# Patient Record
Sex: Male | Born: 1955
Health system: Southern US, Community
[De-identification: ages and names within clinical notes are randomized; demographics above are authoritative.]

## PROBLEM LIST (undated history)

## (undated) DIAGNOSIS — I10 Essential (primary) hypertension: Secondary | ICD-10-CM

## (undated) DIAGNOSIS — M1990 Unspecified osteoarthritis, unspecified site: Secondary | ICD-10-CM

## (undated) DIAGNOSIS — N189 Chronic kidney disease, unspecified: Secondary | ICD-10-CM

## (undated) HISTORY — PX: ROTATOR CUFF REPAIR: SHX139

## (undated) HISTORY — PX: OTHER SURGICAL HISTORY: SHX169

## (undated) HISTORY — PX: ULNAR NERVE TRANSPOSITION: SHX2595

---

## 2010-04-12 ENCOUNTER — Encounter (HOSPITAL_BASED_OUTPATIENT_CLINIC_OR_DEPARTMENT_OTHER)
Admission: RE | Admit: 2010-04-12 | Discharge: 2010-04-12 | Disposition: A | Discharge status: Home / Self Care | Payer: Managed Care, Other (non HMO) | Source: Ambulatory Visit | Attending: Orthopedic Surgery | Admitting: Orthopedic Surgery

## 2010-04-12 DIAGNOSIS — G5620 Lesion of ulnar nerve, unspecified upper limb: Secondary | ICD-10-CM | POA: Insufficient documentation

## 2010-04-13 ENCOUNTER — Ambulatory Visit (HOSPITAL_BASED_OUTPATIENT_CLINIC_OR_DEPARTMENT_OTHER)
Admission: RE | Admit: 2010-04-13 | Discharge: 2010-04-13 | Disposition: A | Discharge status: Home / Self Care | Payer: Managed Care, Other (non HMO) | Source: Ambulatory Visit | Attending: Orthopedic Surgery | Admitting: Orthopedic Surgery

## 2010-04-13 DIAGNOSIS — M6740 Ganglion, unspecified site: Secondary | ICD-10-CM | POA: Insufficient documentation

## 2010-04-13 DIAGNOSIS — Z01812 Encounter for preprocedural laboratory examination: Secondary | ICD-10-CM | POA: Insufficient documentation

## 2010-04-13 DIAGNOSIS — Z01810 Encounter for preprocedural cardiovascular examination: Secondary | ICD-10-CM | POA: Insufficient documentation

## 2010-04-13 DIAGNOSIS — G5620 Lesion of ulnar nerve, unspecified upper limb: Secondary | ICD-10-CM | POA: Insufficient documentation

## 2010-04-13 DIAGNOSIS — M19019 Primary osteoarthritis, unspecified shoulder: Secondary | ICD-10-CM | POA: Insufficient documentation

## 2010-04-13 LAB — POCT HEMOGLOBIN-HEMACUE: Hemoglobin: 15.7 g/dL (ref 13.0–17.0)

## 2010-04-18 NOTE — Op Note (Signed)
NAME:  William, Higgins NO.:  [id number]  MEDICAL RECORD NO.:  [id number]          PATIENT TYPE:  LOCATION:                                 FACILITY:  PHYSICIAN:  Katy Fitch. Tremon Sainvil, M.D.      DATE OF BIRTH:  DATE OF PROCEDURE:  04/13/2010 DATE OF DISCHARGE:                              OPERATIVE REPORT   PREOPERATIVE DIAGNOSIS:  McGowan 3 ulnar neuropathy right elbow with background osteoarthrosis of humeral ulnar articulation.  POSTOPERATIVE DIAGNOSIS:  McGowan 3 ulnar neuropathy right elbow with background osteoarthrosis of humeral ulnar articulation and incidental identification of a myxoid cyst deep to the head of flexor carpi ulnaris likely due to underlying osteoarthrosis of the right humeral ulnar articulation.  OPERATION:  Anterior subcutaneous transposition of right ulnar nerve and resection of myxoid cyst medial aspect of right elbow, deep to the heads of flexor carpi ulnaris.  OPERATING SURGEON:  Katy Fitch. Christinea Brizuela, MD  ASSISTANT:  Marveen Reeks Dasnoit, PA-C  ANESTHESIA:  General by LMA.  SUPERVISING ANESTHESIOLOGIST:  Zenon Mayo, MD  INDICATIONS:  William Higgins is a 55 year old gentleman referred for evaluation and management of right hand weakness and ulnar distribution numbness.  Clinical examination revealed a very athletic 55 year old gentleman who enjoys recreational bike riding and many other athletic pursuits.  William Higgins had a history of progressive numbness in the small finger, ring finger, and dorsal aspect of the hand with proper ulnar distribution. William Higgins also had weakness of pinch prehension and grasp.  Electrodiagnostic studies of the upper extremities revealed a significant right ulnar neuropathy across the elbow.  Plain x-rays of his elbow revealed very significant degenerative change at the elbow due to probably a combination of genetics and his athletic pursuits.  William Higgins has a significant 15+ degree elbow flexion contracture  on the right.  William Higgins did have bony overgrowth at the medial humeral ulnar joint line.  We advised him that William Higgins had a McGowan grade 3 ulnar neuropathy and would benefit from decompression and probable anterior transposition of the nerve.  Our primary indication for transposition was bony overgrowth at the medial humeral ulnar joint line.  After informed consent, William Higgins was brought to the operating room at this time.  PROCEDURE:  William Higgins was brought to room I at the [facility] and placed in supine position on the operating table.  Following an anesthesia consult with Dr. Sampson Goon, general anesthesia by LMA technique was recommended and accepted.  Under Dr. Jarrett Ables direct supervision, general anesthesia by LMA technique was induced followed by routine Betadine scrub and paint of the right upper extremity.  Following exsanguination of the right arm with Esmarch bandage, the arterial tourniquet on proximal brachium was inflated to 220 mmHg.  A routine surgical time-out was accomplished followed by proceeding to create a 7-cm curvilinear incision paralleling the path of the ulnar nerve posterior to the right humeral epicondyle.  Subcutaneous tissues were carefully divided taking care to carefully identify and protect the posterior branch of the medial antebrachial cutaneous nerve.  The ulnar nerve was identified proximal to the epicondyle and decompressed distally with release of the  arcuate ligament, Osborne band, and the fascia at the head of flexor carpi ulnaris.  At the head of flexor carpi ulnaris, we encountered a myxoid cyst likely due to the arthritis at the humeral ulnar articulation.  This was decompressed.  The fascia of the flexor carpi ulnaris was released followed by teasing of muscle fibers. The motor branches of the flexor carpi ulnaris were preserved.  The nerve was mobilized over distance of 8 cm.  The medial intermuscular septum was resected with  cutting cautery down to the humeral shaft.  The nerve was carefully transposed taking care to protect the articular branch and after transposition with the elbow in 90 degrees of flexion, an adipose tissue wall was created at the epicondyle with a running suture of 3-0 Vicryl repairing the subcutaneous fat to the epicondyle. Care was taken to assure that there was excellent nerve glide.  The nerve was easily palpable beneath the skin due to the fact that William Higgins is quite lean.  Thereafter, the tourniquet was released.  Bleeding points were electrocauterized with bipolar current.  We assured that there was no kinking of the nerve at the head of carpal ulnaris by resection of a portion of the FCU fascia.  The wound was then repaired with subcutaneous suture of 3-0 Vicryl, followed by intradermal 3-0 Prolene with Steri-Strips.  The wound margins were infiltrated with 2% lidocaine for postop analgesia.  For aftercare, William Higgins is provided prescriptions for Percocet 5 mg one p.o. q.4-6 h p.r.n. pain, 20 tablets without refill.  Also, we have encouraged him to use Aleve as a nonsteroidal anti-inflammatory analgesic 2 tablets in the morning, and 2 tablets in the evening, also William Higgins is provided doxycycline 100 mg p.o. b.i.d. x4 days as prophylactic antibiotic.     [name], M.D.     RVS/MEDQ  D:  04/13/2010  T:  04/14/2010  Job:  [id number]  cc:   William Higgins, M.D.  Electronically Signed by Josephine Igo M.D. on 04/18/2010 02:52:10 PM

## 2012-08-26 ENCOUNTER — Encounter (HOSPITAL_COMMUNITY): Payer: Self-pay | Admitting: Emergency Medicine

## 2012-08-26 ENCOUNTER — Emergency Department (HOSPITAL_COMMUNITY)
Admission: EM | Admit: 2012-08-26 | Discharge: 2012-08-26 | Disposition: A | Discharge status: Home / Self Care | Payer: Managed Care, Other (non HMO) | Attending: Emergency Medicine | Admitting: Emergency Medicine

## 2012-08-26 DIAGNOSIS — Z79899 Other long term (current) drug therapy: Secondary | ICD-10-CM | POA: Insufficient documentation

## 2012-08-26 DIAGNOSIS — I10 Essential (primary) hypertension: Secondary | ICD-10-CM | POA: Insufficient documentation

## 2012-08-26 DIAGNOSIS — K623 Rectal prolapse: Secondary | ICD-10-CM

## 2012-08-26 HISTORY — DX: Essential (primary) hypertension: I10

## 2012-08-26 LAB — CBC WITH DIFFERENTIAL/PLATELET
Basophils Absolute: 0 10*3/uL (ref 0.0–0.1)
Basophils Relative: 1 % (ref 0–1)
Eosinophils Absolute: 0.1 10*3/uL (ref 0.0–0.7)
Eosinophils Relative: 2 % (ref 0–5)
HCT: 41 % (ref 39.0–52.0)
MCH: 30.8 pg (ref 26.0–34.0)
MCHC: 36.3 g/dL — ABNORMAL HIGH (ref 30.0–36.0)
MCV: 84.9 fL (ref 78.0–100.0)
Monocytes Absolute: 0.4 10*3/uL (ref 0.1–1.0)
Monocytes Relative: 6 % (ref 3–12)
RDW: 12.1 % (ref 11.5–15.5)

## 2012-08-26 LAB — BASIC METABOLIC PANEL
BUN: 24 mg/dL — ABNORMAL HIGH (ref 6–23)
Calcium: 9 mg/dL (ref 8.4–10.5)
Creatinine, Ser: 1.12 mg/dL (ref 0.50–1.35)
GFR calc Af Amer: 84 mL/min — ABNORMAL LOW (ref >90)

## 2012-08-26 MED ORDER — OXYCODONE-ACETAMINOPHEN 5-325 MG PO TABS: 1.0000 | ORAL_TABLET | ORAL | Status: DC | PRN

## 2012-08-26 MED ORDER — MORPHINE SULFATE 4 MG/ML IJ SOLN
4.0000 mg | Freq: Once | INTRAMUSCULAR | Status: AC
  Administered 2012-08-26: 4 mg via INTRAVENOUS
  Filled 2012-08-26: qty 1

## 2012-08-26 NOTE — ED Notes (Signed)
Pt has small part his rectum protruding.some amt of sugar added as per Dr Elesa Massed. Small amt of bleeding

## 2012-08-26 NOTE — ED Provider Notes (Signed)
TIME SEEN: 9:15 PM  CHIEF COMPLAINT: Rectal prolapse  HPI: Patient is a 57 y.o. Caucasian male with no significant past medical history presents emergency department with rectal prolapse. Patient reports that he has had similar episodes since June of 2014 that he has been able to reduce at home. He states today he had a normal bowel movement and during the bowel movement his rectum prolapsing he's been unable to reduce it. He states he feels uncomfortable but is not complaining of significant pain. No fever. No vomiting. He's never had to come to the hospital for infection in the past. No history of surgery to this area.  ROS: See HPI Constitutional: no fever  Eyes: no drainage  ENT: no runny nose   Cardiovascular:  no chest pain  Resp: no SOB  GI: no vomiting GU: no dysuria Integumentary: no rash  Allergy: no hives  Musculoskeletal: no leg swelling  Neurological: no slurred speech ROS otherwise negative  PAST MEDICAL HISTORY/PAST SURGICAL HISTORY:  Past Medical History  Diagnosis Date  . Hypertension     MEDICATIONS:  Prior to Admission medications   Medication Sig Start Date End Date Taking? Authorizing Provider  cholecalciferol (VITAMIN D) 1000 UNITS tablet Take 1,000 Units by mouth daily.   Yes Historical Provider, MD  Multiple Vitamin (MULTIVITAMIN WITH MINERALS) TABS tablet Take 1 tablet by mouth daily.   Yes Historical Provider, MD  naproxen sodium (ANAPROX) 220 MG tablet Take 220 mg by mouth 2 (two) times daily as needed (for pain).   Yes Historical Provider, MD  vitamin B-12 (CYANOCOBALAMIN) 1000 MCG tablet Take 1,000 mcg by mouth daily.   Yes Historical Provider, MD    ALLERGIES:  No Known Allergies  SOCIAL HISTORY:  History  Substance Use Topics  . Smoking status: Never Smoker   . Smokeless tobacco: Not on file  . Alcohol Use: Yes     Comment: social     FAMILY HISTORY: Family History  Problem Relation Age of Onset  . Cancer Mother   . Cancer Father   .  Hypertension Other     EXAM: BP 156/121  Pulse 72  Temp(Src) 98.1 F (36.7 C) (Oral)  Resp 18  SpO2 97% CONSTITUTIONAL: Alert and oriented and responds appropriately to questions. Well-appearing; well-nourished HEAD: Normocephalic EYES: Conjunctivae clear, PERRL ENT: normal nose; no rhinorrhea; moist mucous membranes; pharynx without lesions noted NECK: Supple, no meningismus, no LAD  CARD: RRR; S1 and S2 appreciated; no murmurs, no clicks, no rubs, no gallops RESP: Normal chest excursion without splinting or tachypnea; breath sounds clear and equal bilaterally; no wheezes, no rhonchi, no rales,  ABD/GI: Normal bowel sounds; non-distended; soft, non-tender, no rebound, no guarding RECTAL:  Patient has a large rectal prolapse with gross blood, I am able to reduce part of the prolapse but it will immediately come back out of the rectum BACK:  The back appears normal and is non-tender to palpation, there is no CVA tenderness EXT: Normal ROM in all joints; non-tender to palpation; no edema; normal capillary refill; no cyanosis    SKIN: Normal color for age and race; warm NEURO: Moves all extremities equally PSYCH: The patient's mood and manner are appropriate. Grooming and personal hygiene are appropriate.  MEDICAL DECISION MAKING: Patient with large rectal prolapse that I am unable to reduce. Will place sugar on the rectum to attempt to decrease swelling and reattempt a manual reduction. Anticipate however that surgery will need to be involved given the size of patient's prolapse. No  signs or symptoms of strangulation at this time.  ED PROGRESS: I was able to fully reduce patient's rectal prolapse after sugar had been applied. Patient reports feeling much better. We'll have him followup with surgery as an outpatient. Given customary usual return precautions. Given instructions to increase water and fiber intake, take MiraLax over-the-counter to keep his stool soft, avoid any heavy lifting or  straining, avoid any cycling at this time. Patient verbalizes understanding and is comfortable with plan.  PROCEDURE: Reduction of rectal prolapse. I attempted to reduce patient's rectal prolapse manually. It was unsuccessful with first attempt. She replied to the area but said for 30 minutes to reduce swelling. I was then able to successfully reduce his rectal prolapse with a second attempt.  Layla Maw Parker Sawatzky, DO 08/26/12 2234

## 2012-08-26 NOTE — ED Notes (Signed)
Pt states he went home today and went to have a bowel movement and now has prolapsed bowel  Pt states this happened around 1830

## 2012-09-23 ENCOUNTER — Ambulatory Visit (INDEPENDENT_AMBULATORY_CARE_PROVIDER_SITE_OTHER): Payer: Managed Care, Other (non HMO) | Admitting: General Surgery

## 2012-09-23 ENCOUNTER — Encounter (INDEPENDENT_AMBULATORY_CARE_PROVIDER_SITE_OTHER): Payer: Self-pay | Admitting: General Surgery

## 2012-09-23 VITALS — BP 130/96 | HR 88 | Resp 14 | Ht 69.0 in | Wt 178.4 lb

## 2012-09-23 DIAGNOSIS — K623 Rectal prolapse: Secondary | ICD-10-CM

## 2012-09-23 NOTE — Patient Instructions (Signed)
Rectal Prolapse  What is rectal prolapse? Rectal prolapse is a condition in which the rectum (the lower end of the colon, located just above the anus) becomes stretched out and protrudes out of the anus. Weakness of the anal sphincter muscle is often associated with rectal prolapse at this stage, resulting in leakage of stool or mucus. While the condition occurs in both sexes, it is much more common in women than men. Why does it occur? Several factors may contribute to the development of rectal prolapse. It may come from a lifelong habit of straining to have bowel movements or as a late consequence of the childbirth process. Rarely, there may be a genetic predisposition. It seems to be a part of the aging process in many patients who experience stretching of the ligaments that support the rectum inside the pelvis as well as weakening of the anal sphincter muscle. Sometimes rectal prolapse results from generalized pelvic floor dysfunction, in association with urinary incontinence and pelvic organ prolapse as well. Neurological problems, such as spinal cord transection or spinal cord disease, can also lead to prolapse. In most cases, however, no single cause is identified. Is rectal prolapse the same as hemorrhoids? Some of the symptoms may be the same: bleeding and/or tissue that protrudes from the rectum. Rectal prolapse, however, involves a segment of the bowel located higher up within the body, while hemorrhoids develop near the anal opening. How is rectal prolapse diagnosed? A physician can often diagnose this condition with a careful history and a complete anorectal examination. To demonstrate the prolapse, patients may be asked to sit on a commode and "strain" as if having a bowel movement. Occasionally, a rectal prolapse may be "hidden" or internal, making the diagnosis more difficult. In this situation, an x-ray examination called a videodefecogram may be helpful. This examination, which takes  x-ray pictures while the patient is having a bowel movement, can also assist the physician in determining whether surgery may be beneficial and which operation may be appropriate. Anorectal manometry may also be used to evaluate the function of the muscles around the rectum as they relate to having a bowel movement. How is rectal prolapse treated? Although constipation and straining may contribute to the development of rectal prolapse, simply correcting these problems may not improve the prolapse once it has developed. There are many different ways to surgically correct rectal prolapse. Abdominal or rectal surgery may be suggested. An abdominal repair may be approached laparoscopically in selected patients. The decision to recommend an abdominal or rectal surgery takes into account many factors, including age, physical condition, extent of prolapse and the results of various tests. How successful is treatment? A great majority of patients are completely relieved of symptoms, or are significantly helped, by the appropriate procedure. Success depends on many factors, including the status of a patient's anal sphincter muscle before surgery, whether the prolapse is internal or external, the overall condition of the patient. If the anal sphincter muscles have been weakened, either because of the rectal prolapse or for some other reason, they have the potential to regain strength after the rectal prolapse has been corrected. It may take up to a year to determine the ultimate impact of the surgery on bowel function. Chronic constipation and straining after surgical correction should be avoided.  What is a colon and rectal surgeon? Colon and rectal surgeons are experts in the surgical and non-surgical treatment of diseases of the colon, rectum and anus. They have completed advanced surgical training in the treatment   of these diseases as well as full general surgical training. Board-certified colon and rectal surgeons  complete residencies in general surgery and colon and rectal surgery, and pass intensive examinations conducted by the American Board of Surgery and the American Board of Colon and Rectal Surgery. They are well-versed in the treatment of both benign and malignant diseases of the colon, rectum and anus and are able to perform routine screening examinations and surgically treat conditions if indicated to do so.   2012 American Society of Colon & Rectal Surgeons  

## 2012-09-23 NOTE — Progress Notes (Signed)
Chief Complaint  Patient presents with  . New Evaluation    rectal prolapse    HISTORY: William Higgins is a 57 y.o. male who presents to the office with rectal prolapse.  Other symptoms include bloody discharge after a prolapse.  This had been occurring for several months.   Nothing makes the symptoms worse.   It is intermittent in nature.  His bowel habits are regular and his bowel movements are usually soft.  His fiber intake is good.  His last colonoscopy was in 6/13 and was normal.  He was seen in the ED last month for rectal prolapse reduction.     Past Medical History  Diagnosis Date  . Hypertension       Past Surgical History  Procedure Laterality Date  . Rotator cuff repair    . Vastectomy    . Ulnar nerve transposition          Current Outpatient Prescriptions  Medication Sig Dispense Refill  . cholecalciferol (VITAMIN D) 1000 UNITS tablet Take 1,000 Units by mouth daily.      . Multiple Vitamin (MULTIVITAMIN WITH MINERALS) TABS tablet Take 1 tablet by mouth daily.      . naproxen sodium (ANAPROX) 220 MG tablet Take 220 mg by mouth 2 (two) times daily as needed (for pain).      . vitamin B-12 (CYANOCOBALAMIN) 1000 MCG tablet Take 1,000 mcg by mouth daily.       No current facility-administered medications for this visit.      No Known Allergies    Family History  Problem Relation Age of Onset  . Cancer Mother     breast  . Cancer Father     melenoma, prostate  . Hypertension Other     History   Social History  . Marital Status: Married    Spouse Name: N/A    Number of Children: N/A  . Years of Education: N/A   Social History Main Topics  . Smoking status: Never Smoker   . Smokeless tobacco: Never Used  . Alcohol Use: Yes     Comment: social   . Drug Use: No  . Sexual Activity: None   Other Topics Concern  . None   Social History Narrative  . None      REVIEW OF SYSTEMS - PERTINENT POSITIVES ONLY: Review of Systems - General ROS: negative for -  chills, fever or weight loss Hematological and Lymphatic ROS: negative for - bleeding problems, blood clots or bruising Respiratory ROS: no cough, shortness of breath, or wheezing Cardiovascular ROS: no chest pain or dyspnea on exertion Gastrointestinal ROS: no abdominal pain, change in bowel habits, or black or bloody stools Genito-Urinary ROS: no dysuria, trouble voiding, or hematuria  EXAM: Filed Vitals:   09/23/12 1139  BP: 130/96  Pulse: 88  Resp: 14    General appearance: alert and cooperative Resp: clear to auscultation bilaterally Cardio: regular rate and rhythm GI: soft, non-tender; bowel sounds normal; no masses,  no organomegaly   Procedure: Anoscopy Surgeon: Maisie Fus Diagnosis: rectal prolapse  Assistant: Christella Scheuermann After the risks and benefits were explained, verbal consent was obtained for above procedure  Anesthesia: none Findings: rectal mucosa, left lateral internal hemorrhoid, good rectal tone and squeeze, unable to reproduce at this time    ASSESSMENT AND PLAN: William Higgins is a 57 y.o. M who has had a couple episodes of rectal prolapse, one of which he could not reduce on its own and required reduction in the ED with  sugar.  We discussed the risk and benefits of surgery versus medical management. He is currently on a high-fiber diet with no signs of constipation. He does have a long history of weightlifting and straining in the past. He has only had a couple episodes and can't reproduce it in the office, I recommended that he continue to treat this medically at this time. He understands that if he has a prolapse that cannot be reduced, he needs to to an emergency department as soon as possible for immediate reduction.  We discussed possible surgical options including perineal repairs and laparoscopic rectopexy. We discussed the perineal repairs to recur quickly and that given his good health, he would be a better laparoscopic rectopexy candidate.  I do not think we  would need to do a sigmoid resection. He was given information about this condition as well. I showed his wife the correct method to reduce the prolapse at home. They will call me if his prolapse continues to worsen but will continue with medical management with a high fiber diet and avoidance of straining.  He is not interested in undergoing a surgical procedure at this time.    Vanita Panda, MD Colon and Rectal Surgery / General Surgery [facility]      Visit Diagnoses: 1. Rectal prolapse     Primary Care Physician: Gretel Acre, MD

## 2012-09-29 ENCOUNTER — Encounter (INDEPENDENT_AMBULATORY_CARE_PROVIDER_SITE_OTHER): Payer: Self-pay

## 2014-06-09 ENCOUNTER — Other Ambulatory Visit: Payer: Self-pay | Admitting: Family Medicine

## 2014-06-09 DIAGNOSIS — N189 Chronic kidney disease, unspecified: Secondary | ICD-10-CM

## 2014-06-14 ENCOUNTER — Ambulatory Visit
Admission: RE | Admit: 2014-06-14 | Discharge: 2014-06-14 | Disposition: A | Discharge status: Home / Self Care | Payer: Managed Care, Other (non HMO) | Source: Ambulatory Visit | Attending: Family Medicine | Admitting: Family Medicine

## 2014-06-14 DIAGNOSIS — N189 Chronic kidney disease, unspecified: Secondary | ICD-10-CM

## 2016-04-10 IMAGING — US US RENAL
1 series · 14 of 25 positions shown · non-contrast
Comparison: No previous studies available for review

CLINICAL DATA: Chronic renal insufficiency, hypertension.

EXAM:
RENAL / URINARY TRACT ULTRASOUND COMPLETE

[Series 1: us renal · 0.32mm/px · 14 of 43 slices shown]
[im 1/43]
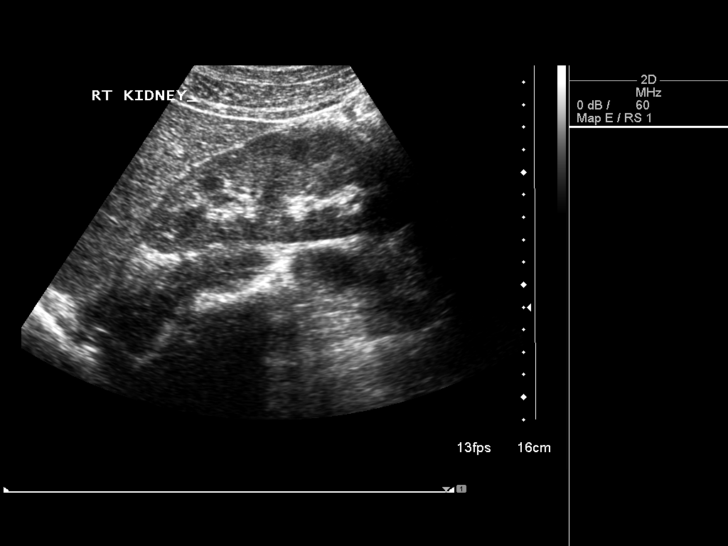
[im 4/43]
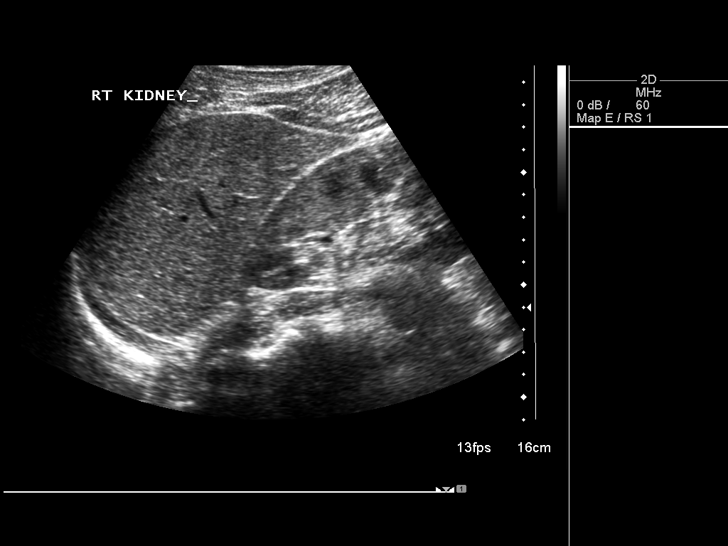
[im 8/43]
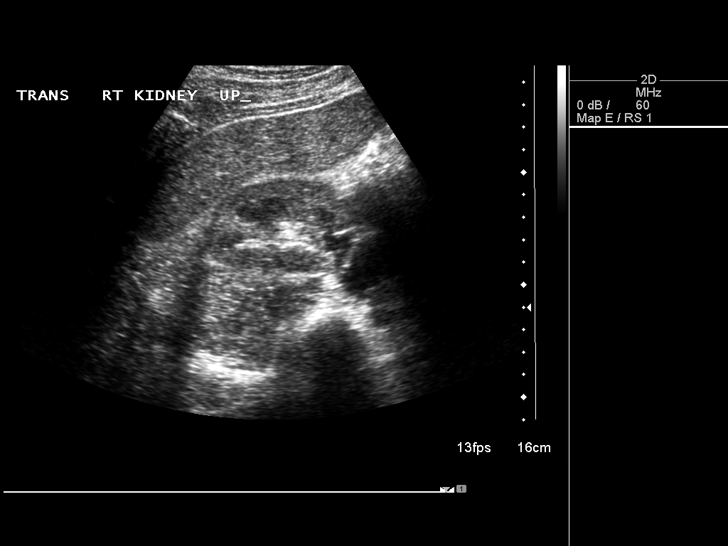
[im 11/43]
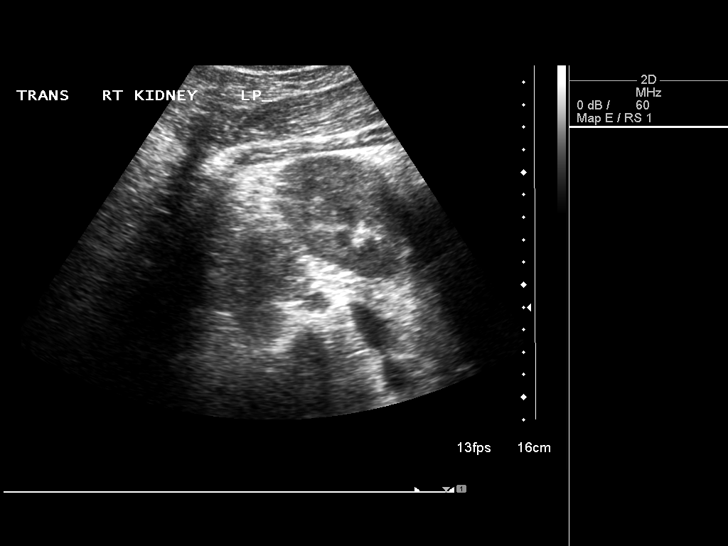
[im 15/43]
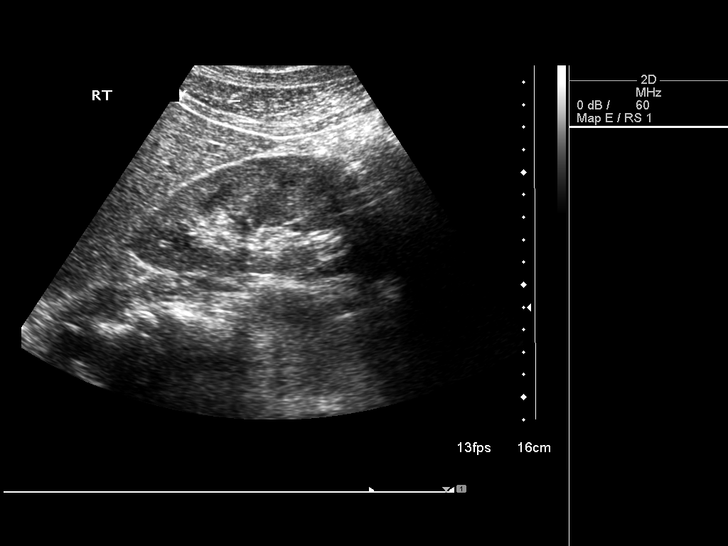
[im 16/43]
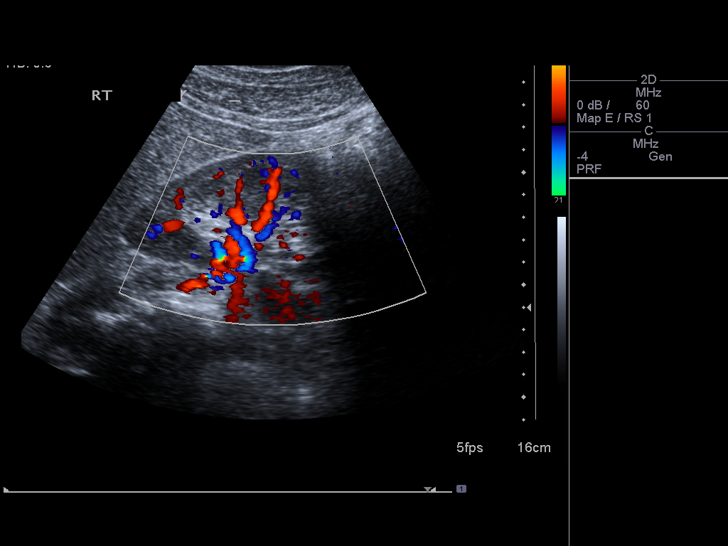
[im 20/43]
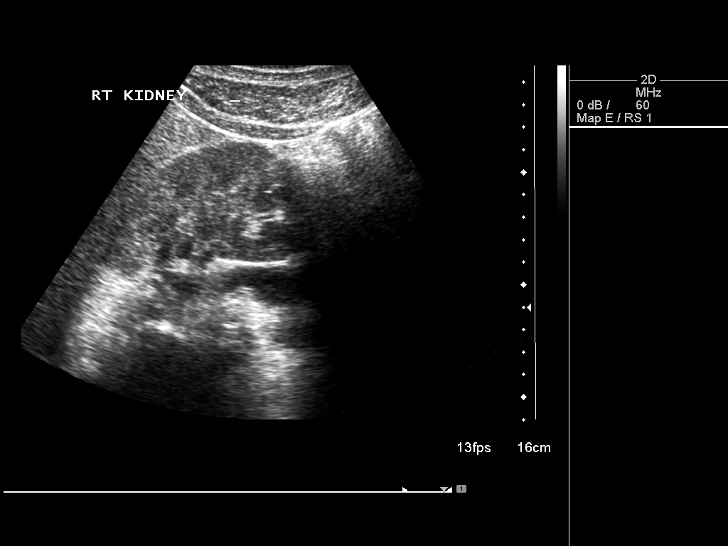
[im 23/43]
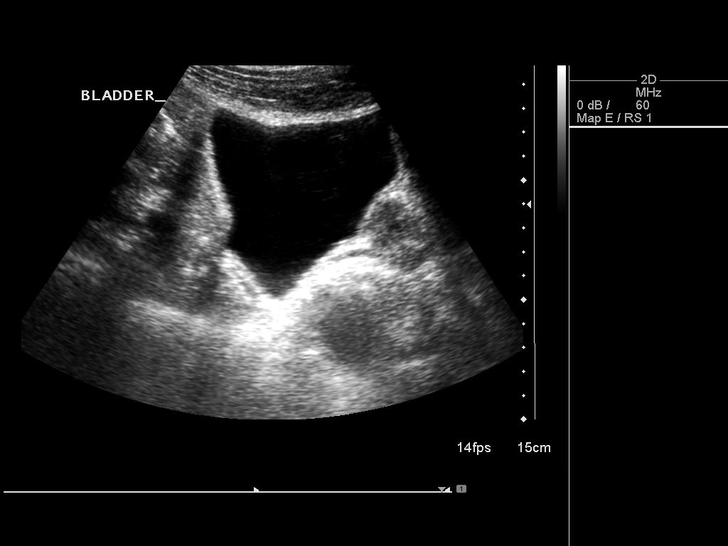
[im 27/43]
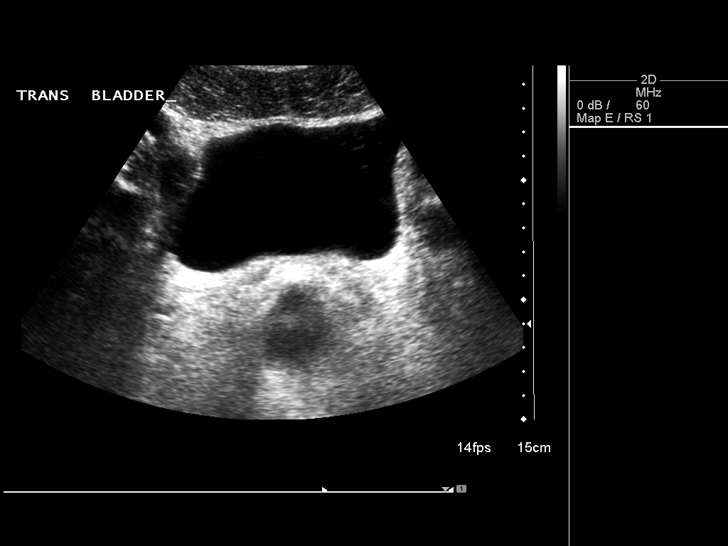
[im 29/43]
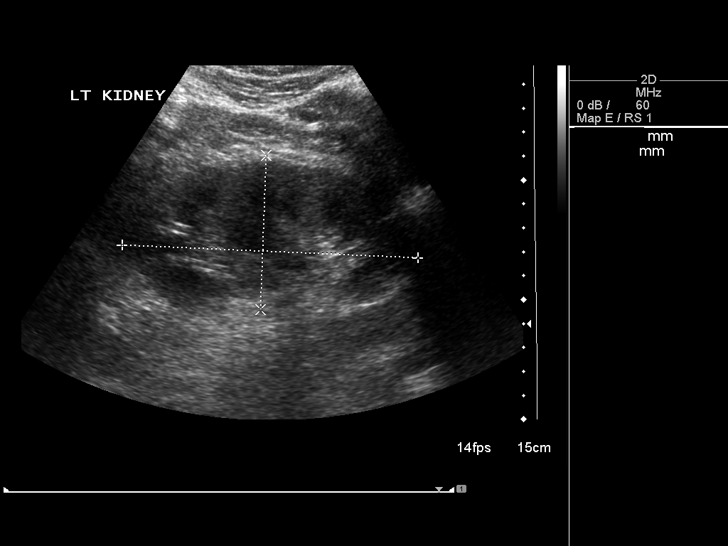
[im 32/43]
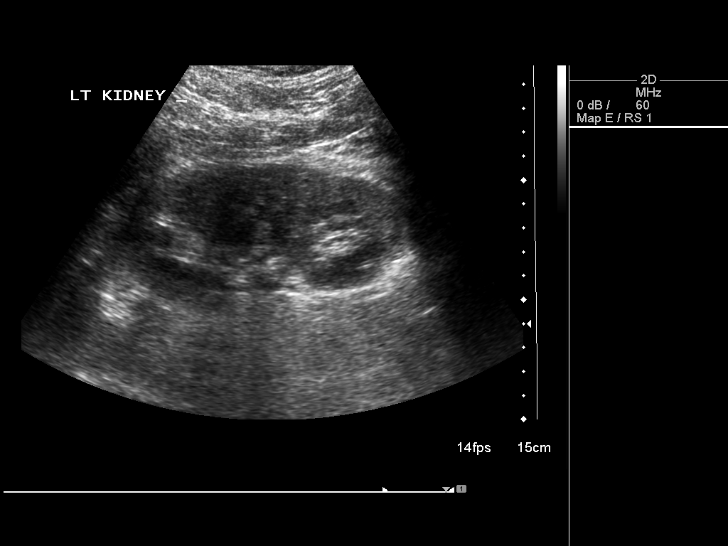
[im 36/43]
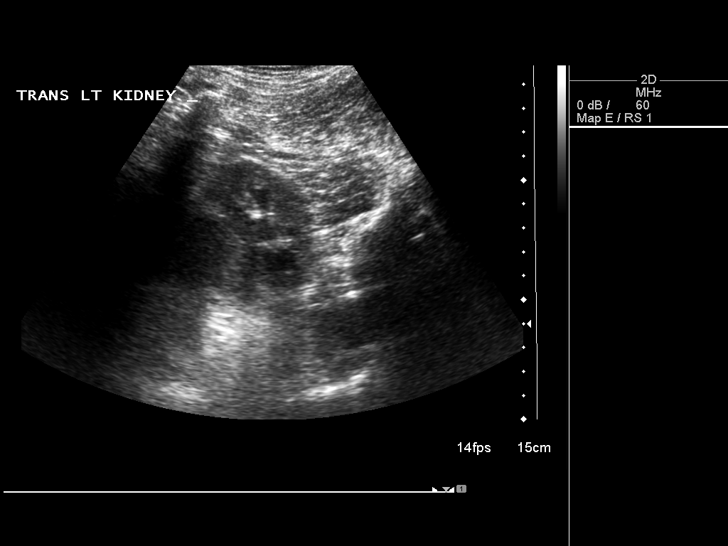
[im 39/43]
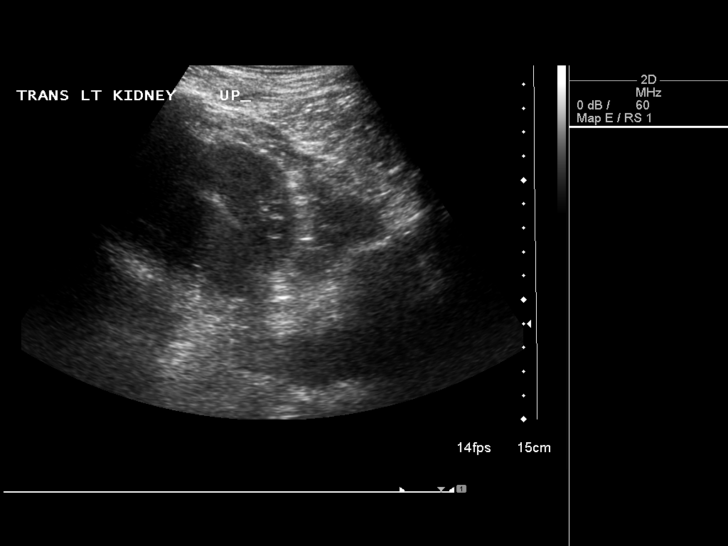
[im 43/43]
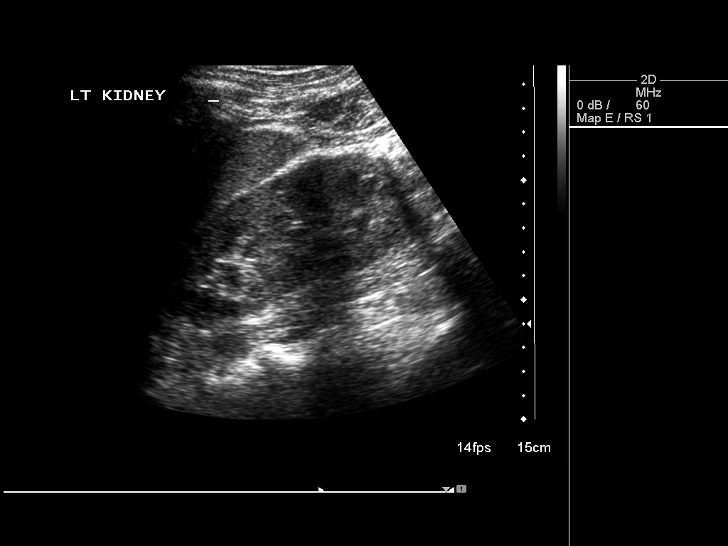

[14 of 25 positions shown; findings below may reference images not displayed]

FINDINGS: Right Kidney:

Length: 12.6 cm. The renal cortical echotexture is slightly less
than that of the adjacent liver. No mass or hydronephrosis
visualized.

Left Kidney:

Length: 12.4 cm. Echogenicity within normal limits. No mass or
hydronephrosis visualized.

Bladder:

The prostate gland is enlarged measuring 7.7 x 5.5 x 7.4 cm. This
produces a moderate impression upon the urinary bladder base.
IMPRESSION: 1. The renal cortical echotexture is mildly increased consistent
with medical renal disease. There is no hydronephrosis. No
parenchymal masses are observed.
2. Prostatic enlargement produces a moderate impression upon the
urinary bladder base.

## 2016-11-26 ENCOUNTER — Ambulatory Visit: Payer: Self-pay | Admitting: Orthopedic Surgery

## 2016-12-04 ENCOUNTER — Encounter (HOSPITAL_COMMUNITY): Payer: Self-pay | Admitting: *Deleted

## 2016-12-05 ENCOUNTER — Ambulatory Visit: Payer: Self-pay | Admitting: Orthopedic Surgery

## 2016-12-05 NOTE — H&P (Signed)
TOTAL HIP ADMISSION H&P  Patient is admitted for right total hip arthroplasty.  Subjective:  Chief Complaint: right hip pain  HPI: William Higgins, [age] y.o. male, has a history of pain and functional disability in the right hip(s) due to arthritis and patient has failed non-surgical conservative treatments for greater than 12 weeks to include NSAID's and/or analgesics, flexibility and strengthening excercises, use of assistive devices, weight reduction as appropriate and activity modification.  Onset of symptoms was gradual starting 2 years ago with rapidlly worsening course since that time.The patient noted no past surgery on the right hip(s).  Patient currently rates pain in the right hip at 10 out of 10 with activity. Patient has night pain, worsening of pain with activity and weight bearing, pain that interfers with activities of daily living, pain with passive range of motion and crepitus. Patient has evidence of subchondral sclerosis, periarticular osteophytes and joint space narrowing by imaging studies. This condition presents safety issues increasing the risk of falls. There is no current active infection.  Patient Active Problem List   Diagnosis Date Noted  . Rectal prolapse 09/23/2012   Past Medical History:  Diagnosis Date  . Hypertension     Past Surgical History:  Procedure Laterality Date  . ROTATOR CUFF REPAIR    . ULNAR NERVE TRANSPOSITION    . vastectomy      Current Outpatient Medications  Medication Sig Dispense Refill Last Dose  . cholecalciferol (VITAMIN D) 1000 UNITS tablet Take 1,000 Units by mouth daily.   Taking  . Multiple Vitamin (MULTIVITAMIN WITH MINERALS) TABS tablet Take 1 tablet by mouth daily.   Taking  . naproxen sodium (ANAPROX) 220 MG tablet Take 220 mg by mouth 2 (two) times daily as needed (for pain).   Taking  . vitamin B-12 (CYANOCOBALAMIN) 1000 MCG tablet Take 1,000 mcg by mouth daily.   Taking   No current facility-administered medications for  this visit.    No Known Allergies  Social History   Tobacco Use  . Smoking status: Never Smoker  . Smokeless tobacco: Never Used  Substance Use Topics  . Alcohol use: Yes    Comment: social     Family History  Problem Relation Age of Onset  . Cancer Mother        breast  . Cancer Father        melenoma, prostate  . Hypertension Other      Review of Systems  Constitutional: Negative.   HENT: Negative.   Eyes: Negative.   Respiratory: Negative.   Cardiovascular: Negative.   Gastrointestinal: Negative.   Genitourinary: Negative.   Musculoskeletal: Positive for joint pain.  Skin: Negative.   Neurological: Negative.   Endo/Heme/Allergies: Negative.     Objective:  Physical Exam  Vitals reviewed. Constitutional: He is oriented to person, place, and time. He appears well-developed and well-nourished.  HENT:  Head: Normocephalic and atraumatic.  Eyes: Conjunctivae and EOM are normal. Pupils are equal, round, and reactive to light.  Neck: Normal range of motion. Neck supple.  Cardiovascular: Normal rate, regular rhythm and intact distal pulses.  Respiratory: Effort normal. No respiratory distress.  GI: Soft. He exhibits no distension.  Genitourinary:  Genitourinary Comments: deferred  Musculoskeletal:       Right hip: He exhibits decreased range of motion, bony tenderness and crepitus.  Neurological: He is alert and oriented to person, place, and time. He has normal reflexes.  Skin: Skin is warm and dry.  Psychiatric: He has a normal mood and  affect. His behavior is normal. Judgment and thought content normal.    Vital signs in last 24 hours: @VSRANGES @  Labs:   Estimated body mass index is 26.35 kg/m as calculated from the following:   Height as of 09/23/12: 5\' 9"  (1.753 m).   Weight as of 09/23/12: 80.9 kg (178 lb 6.4 oz).   Imaging Review Plain radiographs demonstrate severe degenerative joint disease of the right hip(s). The bone quality appears to be  adequate for age and reported activity level.  Assessment/Plan:  End stage arthritis, right hip(s)  The patient history, physical examination, clinical judgement of the provider and imaging studies are consistent with end stage degenerative joint disease of the right hip(s) and total hip arthroplasty is deemed medically necessary. The treatment options including medical management, injection therapy, arthroscopy and arthroplasty were discussed at length. The risks and benefits of total hip arthroplasty were presented and reviewed. The risks due to aseptic loosening, infection, stiffness, dislocation/subluxation,  thromboembolic complications and other imponderables were discussed.  The patient acknowledged the explanation, agreed to proceed with the plan and consent was signed. Patient is being admitted for inpatient treatment for surgery, pain control, PT, OT, prophylactic antibiotics, VTE prophylaxis, progressive ambulation and ADL's and discharge planning.The patient is planning to be discharged home with HEP

## 2016-12-05 NOTE — H&P (View-Only) (Signed)
TOTAL HIP ADMISSION H&P  Patient is admitted for right total hip arthroplasty.  Subjective:  Chief Complaint: right hip pain  HPI: William Higgins, [age] y.o. male, has a history of pain and functional disability in the right hip(s) due to arthritis and patient has failed non-surgical conservative treatments for greater than 12 weeks to include NSAID's and/or analgesics, flexibility and strengthening excercises, use of assistive devices, weight reduction as appropriate and activity modification.  Onset of symptoms was gradual starting 2 years ago with rapidlly worsening course since that time.The patient noted no past surgery on the right hip(s).  Patient currently rates pain in the right hip at 10 out of 10 with activity. Patient has night pain, worsening of pain with activity and weight bearing, pain that interfers with activities of daily living, pain with passive range of motion and crepitus. Patient has evidence of subchondral sclerosis, periarticular osteophytes and joint space narrowing by imaging studies. This condition presents safety issues increasing the risk of falls. There is no current active infection.  Patient Active Problem List   Diagnosis Date Noted  . Rectal prolapse 09/23/2012   Past Medical History:  Diagnosis Date  . Hypertension     Past Surgical History:  Procedure Laterality Date  . ROTATOR CUFF REPAIR    . ULNAR NERVE TRANSPOSITION    . vastectomy      Current Outpatient Medications  Medication Sig Dispense Refill Last Dose  . cholecalciferol (VITAMIN D) 1000 UNITS tablet Take 1,000 Units by mouth daily.   Taking  . Multiple Vitamin (MULTIVITAMIN WITH MINERALS) TABS tablet Take 1 tablet by mouth daily.   Taking  . naproxen sodium (ANAPROX) 220 MG tablet Take 220 mg by mouth 2 (two) times daily as needed (for pain).   Taking  . vitamin B-12 (CYANOCOBALAMIN) 1000 MCG tablet Take 1,000 mcg by mouth daily.   Taking   No current facility-administered medications for  this visit.    No Known Allergies  Social History   Tobacco Use  . Smoking status: Never Smoker  . Smokeless tobacco: Never Used  Substance Use Topics  . Alcohol use: Yes    Comment: social     Family History  Problem Relation Age of Onset  . Cancer Mother        breast  . Cancer Father        melenoma, prostate  . Hypertension Other      Review of Systems  Constitutional: Negative.   HENT: Negative.   Eyes: Negative.   Respiratory: Negative.   Cardiovascular: Negative.   Gastrointestinal: Negative.   Genitourinary: Negative.   Musculoskeletal: Positive for joint pain.  Skin: Negative.   Neurological: Negative.   Endo/Heme/Allergies: Negative.     Objective:  Physical Exam  Vitals reviewed. Constitutional: He is oriented to person, place, and time. He appears well-developed and well-nourished.  HENT:  Head: Normocephalic and atraumatic.  Eyes: Conjunctivae and EOM are normal. Pupils are equal, round, and reactive to light.  Neck: Normal range of motion. Neck supple.  Cardiovascular: Normal rate, regular rhythm and intact distal pulses.  Respiratory: Effort normal. No respiratory distress.  GI: Soft. He exhibits no distension.  Genitourinary:  Genitourinary Comments: deferred  Musculoskeletal:       Right hip: He exhibits decreased range of motion, bony tenderness and crepitus.  Neurological: He is alert and oriented to person, place, and time. He has normal reflexes.  Skin: Skin is warm and dry.  Psychiatric: He has a normal mood and  affect. His behavior is normal. Judgment and thought content normal.    Vital signs in last 24 hours: @VSRANGES@  Labs:   Estimated body mass index is 26.35 kg/m as calculated from the following:   Height as of 09/23/12: 5' 9" (1.753 m).   Weight as of 09/23/12: 80.9 kg (178 lb 6.4 oz).   Imaging Review Plain radiographs demonstrate severe degenerative joint disease of the right hip(s). The bone quality appears to be  adequate for age and reported activity level.  Assessment/Plan:  End stage arthritis, right hip(s)  The patient history, physical examination, clinical judgement of the provider and imaging studies are consistent with end stage degenerative joint disease of the right hip(s) and total hip arthroplasty is deemed medically necessary. The treatment options including medical management, injection therapy, arthroscopy and arthroplasty were discussed at length. The risks and benefits of total hip arthroplasty were presented and reviewed. The risks due to aseptic loosening, infection, stiffness, dislocation/subluxation,  thromboembolic complications and other imponderables were discussed.  The patient acknowledged the explanation, agreed to proceed with the plan and consent was signed. Patient is being admitted for inpatient treatment for surgery, pain control, PT, OT, prophylactic antibiotics, VTE prophylaxis, progressive ambulation and ADL's and discharge planning.The patient is planning to be discharged home with HEP 

## 2016-12-14 ENCOUNTER — Other Ambulatory Visit (HOSPITAL_COMMUNITY): Payer: Self-pay | Admitting: Emergency Medicine

## 2016-12-14 NOTE — Patient Instructions (Signed)
William BustardDavid Higgins  12/14/2016   Your procedure is scheduled on: 12-20-16  [facility]  Entrance [place]  elevators to 3rd floor to  Short Stay Center at 530AM.   Call this number if you have problems the morning of surgery [phone]    Remember: ONLY 1 PERSON MAY GO WITH YOU TO SHORT STAY TO GET  READY MORNING OF YOUR SURGERY.    Do not eat food or drink liquids :After Midnight.     Take these medicines the morning of surgery with A SIP OF WATER: amlodipine                                 You may not have any metal on your body including hair pins and              piercings  Do not wear jewelry, make-up, lotions, powders or perfumes, deodorant                    Men may shave face and neck.   Do not bring valuables to the hospital. [facility] IS NOT             RESPONSIBLE   FOR VALUABLES.  Contacts, dentures or bridgework may not be worn into surgery.  Leave suitcase in the car. After surgery it may be brought to your room.                 Please read over the following fact sheets you were given: _____________________________________________________________________             [name] - Preparing for Surgery Before surgery, you can play an important role.  Because skin is not sterile, your skin needs to be as free of germs as possible.  You can reduce the number of germs on your skin by washing with CHG (chlorahexidine gluconate) soap before surgery.  CHG is an antiseptic cleaner which kills germs and bonds with the skin to continue killing germs even after washing. Please DO NOT use if you have an allergy to CHG or antibacterial soaps.  If your skin becomes reddened/irritated stop using the CHG and inform your nurse when you arrive at Short Stay. Do not shave (including legs and underarms) for at least 48 hours prior to the first CHG shower.  You may shave your face/neck. Please follow these instructions carefully:  1.  Shower  with CHG Soap the night before surgery and the  morning of Surgery.  2.  If you choose to wash your hair, wash your hair first as usual with your  normal  shampoo.  3.  After you shampoo, rinse your hair and body thoroughly to remove the  shampoo.                           4.  Use CHG as you would any other liquid soap.  You can apply chg directly  to the skin and wash                       Gently with a scrungie or clean washcloth.  5.  Apply the CHG Soap to your body ONLY FROM THE NECK DOWN.   Do not use on face/ open  Wound or open sores. Avoid contact with eyes, ears mouth and genitals (private parts).                       Wash face,  Genitals (private parts) with your normal soap.             6.  Wash thoroughly, paying special attention to the area where your surgery  will be performed.  7.  Thoroughly rinse your body with warm water from the neck down.  8.  DO NOT shower/wash with your normal soap after using and rinsing off  the CHG Soap.                9.  Pat yourself dry with a clean towel.            10.  Wear clean pajamas.            11.  Place clean sheets on your bed the night of your first shower and do not  sleep with pets. Day of Surgery : Do not apply any lotions/deodorants the morning of surgery.  Please wear clean clothes to the hospital/surgery center.  FAILURE TO FOLLOW THESE INSTRUCTIONS MAY RESULT IN THE CANCELLATION OF YOUR SURGERY PATIENT SIGNATURE_________________________________  NURSE SIGNATURE__________________________________  ________________________________________________________________________   William Higgins  An incentive spirometer is a tool that can help keep your lungs clear and active. This tool measures how well you are filling your lungs with each breath. Taking long deep breaths may help reverse or decrease the chance of developing breathing (pulmonary) problems (especially infection) following:  A long period  of time when you are unable to move or be active. BEFORE THE PROCEDURE   If the spirometer includes an indicator to show your best effort, your nurse or respiratory therapist will set it to a desired goal.  If possible, sit up straight or lean slightly forward. Try not to slouch.  Hold the incentive spirometer in an upright position. INSTRUCTIONS FOR USE  1. Sit on the edge of your bed if possible, or sit up as far as you can in bed or on a chair. 2. Hold the incentive spirometer in an upright position. 3. Breathe out normally. 4. Place the mouthpiece in your mouth and seal your lips tightly around it. 5. Breathe in slowly and as deeply as possible, raising the piston or the ball toward the top of the column. 6. Hold your breath for 3-5 seconds or for as long as possible. Allow the piston or ball to fall to the bottom of the column. 7. Remove the mouthpiece from your mouth and breathe out normally. 8. Rest for a few seconds and repeat Steps 1 through 7 at least 10 times every 1-2 hours when you are awake. Take your time and take a few normal breaths between deep breaths. 9. The spirometer may include an indicator to show your best effort. Use the indicator as a goal to work toward during each repetition. 10. After each set of 10 deep breaths, practice coughing to be sure your lungs are clear. If you have an incision (the cut made at the time of surgery), support your incision when coughing by placing a pillow or rolled up towels firmly against it. Once you are able to get out of bed, walk around indoors and cough well. You may stop using the incentive spirometer when instructed by your caregiver.  RISKS AND COMPLICATIONS  Take your time so you do not get  dizzy or light-headed.  If you are in pain, you may need to take or ask for pain medication before doing incentive spirometry. It is harder to take a deep breath if you are having pain. AFTER USE  Rest and breathe slowly and easily.  It  can be helpful to keep track of a log of your progress. Your caregiver can provide you with a simple table to help with this. If you are using the spirometer at home, follow these instructions: [place] IF:   You are having difficultly using the spirometer.  You have trouble using the spirometer as often as instructed.  Your pain medication is not giving enough relief while using the spirometer.  You develop fever of 100.5 F (38.1 C) or higher. SEEK IMMEDIATE MEDICAL CARE IF:   You cough up bloody sputum that had not been present before.  You develop fever of 102 F (38.9 C) or greater.  You develop worsening pain at or near the incision site. MAKE SURE YOU:   Understand these instructions.  Will watch your condition.  Will get help right away if you are not doing well or get worse. Document Released: 05/07/2006 Document Revised: 03/19/2011 Document Reviewed: 07/08/2006 Exit[employer] Patient Information 2014 Exit[employer], [place].   ________________________________________________________________________  WHAT IS A BLOOD TRANSFUSION? Blood Transfusion Information  A transfusion is the replacement of blood or some of its parts. Blood is made up of multiple cells which provide different functions.  Red blood cells carry oxygen and are used for blood loss replacement.  White blood cells fight against infection.  Platelets control bleeding.  Plasma helps clot blood.  Other blood products are available for specialized needs, such as hemophilia or other clotting disorders. BEFORE THE TRANSFUSION  Who gives blood for transfusions?   Healthy volunteers who are fully evaluated to make sure their blood is safe. This is blood bank blood. Transfusion therapy is the safest it has ever been in the practice of medicine. Before blood is taken from a donor, a complete history is taken to make sure that person has no history of diseases nor engages in risky social behavior (examples  are intravenous drug use or sexual activity with multiple partners). The donor's travel history is screened to minimize risk of transmitting infections, such as malaria. The donated blood is tested for signs of infectious diseases, such as HIV and hepatitis. The blood is then tested to be sure it is compatible with you in order to minimize the chance of a transfusion reaction. If you or a relative donates blood, this is often done in anticipation of surgery and is not appropriate for emergency situations. It takes many days to process the donated blood. RISKS AND COMPLICATIONS Although transfusion therapy is very safe and saves many lives, the main dangers of transfusion include:   Getting an infectious disease.  Developing a transfusion reaction. This is an allergic reaction to something in the blood you were given. Every precaution is taken to prevent this. The decision to have a blood transfusion has been considered carefully by your caregiver before blood is given. Blood is not given unless the benefits outweigh the risks. AFTER THE TRANSFUSION  Right after receiving a blood transfusion, you will usually feel much better and more energetic. This is especially true if your red blood cells have gotten low (anemic). The transfusion raises the level of the red blood cells which carry oxygen, and this usually causes an energy increase.  The nurse administering the transfusion will  monitor you carefully for complications. HOME CARE INSTRUCTIONS  No special instructions are needed after a transfusion. You may find your energy is better. Speak with your caregiver about any limitations on activity for underlying diseases you may have. SEEK MEDICAL CARE IF:   Your condition is not improving after your transfusion.  You develop redness or irritation at the intravenous (IV) site. SEEK IMMEDIATE MEDICAL CARE IF:  Any of the following symptoms occur over the next 12 hours:  Shaking chills.  You have a  temperature by mouth above 102 F (38.9 C), not controlled by medicine.  Chest, back, or muscle pain.  People around you feel you are not acting correctly or are confused.  Shortness of breath or difficulty breathing.  Dizziness and fainting.  You get a rash or develop hives.  You have a decrease in urine output.  Your urine turns a dark color or changes to pink, red, or brown. Any of the following symptoms occur over the next 10 days:  You have a temperature by mouth above 102 F (38.9 C), not controlled by medicine.  Shortness of breath.  Weakness after normal activity.  The white part of the eye turns yellow (jaundice).  You have a decrease in the amount of urine or are urinating less often.  Your urine turns a dark color or changes to pink, red, or brown. Document Released: 12/23/1999 Document Revised: 03/19/2011 Document Reviewed: 08/11/2007 [facility] Patient Information 2014 [place], [place].  _______________________________________________________________________

## 2016-12-14 NOTE — Progress Notes (Signed)
LOV/clearance Dr Gabriela EvesLisa Milelr 11-14-16 on chart   EKG 11-14-16 on chart [facility] [employer]   cxr 11-15-16 on [facility] [facility] [employer]

## 2016-12-17 ENCOUNTER — Encounter (HOSPITAL_COMMUNITY)
Admission: RE | Admit: 2016-12-17 | Discharge: 2016-12-17 | Disposition: A | Discharge status: Home / Self Care | Payer: Managed Care, Other (non HMO) | Source: Ambulatory Visit | Attending: Orthopedic Surgery | Admitting: Orthopedic Surgery

## 2016-12-17 ENCOUNTER — Encounter (HOSPITAL_COMMUNITY): Payer: Self-pay

## 2016-12-17 ENCOUNTER — Other Ambulatory Visit: Payer: Self-pay

## 2016-12-17 HISTORY — DX: Chronic kidney disease, unspecified: N18.9

## 2016-12-17 HISTORY — DX: Unspecified osteoarthritis, unspecified site: M19.90

## 2016-12-18 ENCOUNTER — Other Ambulatory Visit (HOSPITAL_COMMUNITY): Payer: Self-pay | Admitting: *Deleted

## 2016-12-19 ENCOUNTER — Encounter (HOSPITAL_COMMUNITY)
Admission: RE | Admit: 2016-12-19 | Discharge: 2016-12-19 | Disposition: A | Discharge status: Home / Self Care | Payer: Managed Care, Other (non HMO) | Source: Ambulatory Visit | Attending: Orthopedic Surgery | Admitting: Orthopedic Surgery

## 2016-12-19 LAB — CBC
HCT: 40.2 % (ref 39.0–52.0)
HEMOGLOBIN: 13.9 g/dL (ref 13.0–17.0)
MCH: 30.1 pg (ref 26.0–34.0)
MCHC: 34.6 g/dL (ref 30.0–36.0)
MCV: 87 fL (ref 78.0–100.0)
Platelets: 161 10*3/uL (ref 150–400)
RBC: 4.62 MIL/uL (ref 4.22–5.81)
RDW: 12.4 % (ref 11.5–15.5)
WBC: 5.6 10*3/uL (ref 4.0–10.5)

## 2016-12-19 LAB — BASIC METABOLIC PANEL
ANION GAP: 7 (ref 5–15)
BUN: 25 mg/dL — ABNORMAL HIGH (ref 6–20)
CALCIUM: 9.5 mg/dL (ref 8.9–10.3)
CO2: 27 mmol/L (ref 22–32)
Chloride: 103 mmol/L (ref 101–111)
Creatinine, Ser: 1.17 mg/dL (ref 0.61–1.24)
GFR calc Af Amer: >60 mL/min (ref >60)
GFR calc non Af Amer: >60 mL/min (ref >60)
GLUCOSE: 90 mg/dL (ref 65–99)
Potassium: 3.9 mmol/L (ref 3.5–5.1)
Sodium: 137 mmol/L (ref 135–145)

## 2016-12-19 LAB — SURGICAL PCR SCREEN
MRSA, PCR: NEGATIVE
STAPHYLOCOCCUS AUREUS: POSITIVE — AB

## 2016-12-19 LAB — ABO/RH: ABO/RH(D): A POS

## 2016-12-19 NOTE — Anesthesia Preprocedure Evaluation (Addendum)
Anesthesia Evaluation  Patient identified by MRN, date of birth, ID band Patient awake    Reviewed: Allergy & Precautions, NPO status , Patient's Chart, lab work & pertinent test results  Airway Mallampati: II  TM Distance: >3 FB Neck ROM: Full    Dental no notable dental hx.    Pulmonary neg pulmonary ROS,    Pulmonary exam normal breath sounds clear to auscultation       Cardiovascular hypertension, Pt. on medications Normal cardiovascular exam Rhythm:Regular Rate:Normal     Neuro/Psych negative neurological ROS  negative psych ROS   GI/Hepatic negative GI ROS, Neg liver ROS,   Endo/Other  negative endocrine ROS  Renal/GU CRFRenal diseasechronic kidney disease followed by medical md stage 3     Musculoskeletal  (+) Arthritis , Osteoarthritis,    Abdominal   Peds  Hematology negative hematology ROS (+)   Anesthesia Other Findings   Reproductive/Obstetrics                            Anesthesia Physical Anesthesia Plan  ASA: II  Anesthesia Plan: Spinal   Post-op Pain Management:    Induction: Intravenous  PONV Risk Score and Plan:   Airway Management Planned: Natural Airway  Additional Equipment:   Intra-op Plan:   Post-operative Plan:   Informed Consent: I have reviewed the patients History and Physical, chart, labs and discussed the procedure including the risks, benefits and alternatives for the proposed anesthesia with the patient or authorized representative who has indicated his/her understanding and acceptance.   Dental advisory given  Plan Discussed with: CRNA  Anesthesia Plan Comments:        Anesthesia Quick Evaluation

## 2016-12-20 ENCOUNTER — Other Ambulatory Visit: Payer: Self-pay

## 2016-12-20 ENCOUNTER — Encounter (HOSPITAL_COMMUNITY): Payer: Self-pay | Admitting: Emergency Medicine

## 2016-12-20 ENCOUNTER — Inpatient Hospital Stay (HOSPITAL_COMMUNITY): Payer: Managed Care, Other (non HMO)

## 2016-12-20 ENCOUNTER — Encounter (HOSPITAL_COMMUNITY)
Admission: RE | Disposition: A | Discharge status: Home / Self Care | Payer: Self-pay | Source: Ambulatory Visit | Attending: Orthopedic Surgery

## 2016-12-20 ENCOUNTER — Inpatient Hospital Stay (HOSPITAL_COMMUNITY): Payer: Managed Care, Other (non HMO) | Admitting: Certified Registered Nurse Anesthetist

## 2016-12-20 ENCOUNTER — Inpatient Hospital Stay (HOSPITAL_COMMUNITY)
Admission: RE | Admit: 2016-12-20 | Discharge: 2016-12-21 | DRG: 470 | Disposition: A | Discharge status: Home / Self Care | Payer: Managed Care, Other (non HMO) | Source: Ambulatory Visit | Attending: Orthopedic Surgery | Admitting: Orthopedic Surgery

## 2016-12-20 DIAGNOSIS — Z01812 Encounter for preprocedural laboratory examination: Secondary | ICD-10-CM

## 2016-12-20 DIAGNOSIS — Z0183 Encounter for blood typing: Secondary | ICD-10-CM | POA: Diagnosis not present

## 2016-12-20 DIAGNOSIS — Z01811 Encounter for preprocedural respiratory examination: Secondary | ICD-10-CM | POA: Diagnosis not present

## 2016-12-20 DIAGNOSIS — I129 Hypertensive chronic kidney disease with stage 1 through stage 4 chronic kidney disease, or unspecified chronic kidney disease: Secondary | ICD-10-CM | POA: Diagnosis present

## 2016-12-20 DIAGNOSIS — M25551 Pain in right hip: Secondary | ICD-10-CM | POA: Diagnosis present

## 2016-12-20 DIAGNOSIS — Z01810 Encounter for preprocedural cardiovascular examination: Secondary | ICD-10-CM

## 2016-12-20 DIAGNOSIS — N183 Chronic kidney disease, stage 3 (moderate): Secondary | ICD-10-CM | POA: Diagnosis present

## 2016-12-20 DIAGNOSIS — M1611 Unilateral primary osteoarthritis, right hip: Principal | ICD-10-CM | POA: Diagnosis present

## 2016-12-20 DIAGNOSIS — Z419 Encounter for procedure for purposes other than remedying health state, unspecified: Secondary | ICD-10-CM

## 2016-12-20 DIAGNOSIS — Z09 Encounter for follow-up examination after completed treatment for conditions other than malignant neoplasm: Secondary | ICD-10-CM

## 2016-12-20 HISTORY — PX: TOTAL HIP ARTHROPLASTY: SHX124

## 2016-12-20 LAB — TYPE AND SCREEN
ABO/RH(D): A POS
Antibody Screen: NEGATIVE

## 2016-12-20 SURGERY — ARTHROPLASTY, HIP, TOTAL, ANTERIOR APPROACH
Anesthesia: Spinal | Site: Hip | Laterality: Right

## 2016-12-20 MED ORDER — DEXAMETHASONE SODIUM PHOSPHATE 10 MG/ML IJ SOLN
INTRAMUSCULAR | Status: DC | PRN
  Administered 2016-12-20: 10 mg via INTRAVENOUS

## 2016-12-20 MED ORDER — ACETAMINOPHEN 325 MG PO TABS: 650.0000 mg | ORAL_TABLET | ORAL | Status: DC | PRN

## 2016-12-20 MED ORDER — BUPIVACAINE-EPINEPHRINE (PF) 0.25% -1:200000 IJ SOLN
INTRAMUSCULAR | Status: AC
  Filled 2016-12-20: qty 30

## 2016-12-20 MED ORDER — POVIDONE-IODINE 10 % EX SWAB
2.0000 "application " | Freq: Once | CUTANEOUS | Status: AC
  Administered 2016-12-20: 2 via TOPICAL

## 2016-12-20 MED ORDER — SODIUM CHLORIDE 0.9 % IV SOLN
INTRAVENOUS | Status: DC
  Administered 2016-12-20 – 2016-12-21 (×2): via INTRAVENOUS

## 2016-12-20 MED ORDER — ACETAMINOPHEN 10 MG/ML IV SOLN
INTRAVENOUS | Status: AC
  Filled 2016-12-20: qty 100

## 2016-12-20 MED ORDER — LOSARTAN POTASSIUM 25 MG PO TABS
25.0000 mg | ORAL_TABLET | Freq: Every day | ORAL | Status: DC
  Administered 2016-12-21: 09:00:00 25 mg via ORAL
  Filled 2016-12-20 (×2): qty 1

## 2016-12-20 MED ORDER — HYDROCODONE-ACETAMINOPHEN 5-325 MG PO TABS
1.0000 | ORAL_TABLET | ORAL | Status: DC | PRN
  Administered 2016-12-20: 23:00:00 1 via ORAL
  Filled 2016-12-20: qty 1

## 2016-12-20 MED ORDER — SENNA 8.6 MG PO TABS: 2.0000 | ORAL_TABLET | Freq: Every day | ORAL | Status: DC

## 2016-12-20 MED ORDER — DEXAMETHASONE SODIUM PHOSPHATE 10 MG/ML IJ SOLN
INTRAMUSCULAR | Status: AC
  Filled 2016-12-20: qty 1

## 2016-12-20 MED ORDER — FENTANYL CITRATE (PF) 100 MCG/2ML IJ SOLN: 25.0000 ug | INTRAMUSCULAR | Status: DC | PRN

## 2016-12-20 MED ORDER — TRANEXAMIC ACID 1000 MG/10ML IV SOLN
1000.0000 mg | Freq: Once | INTRAVENOUS | Status: AC
  Administered 2016-12-20: 1000 mg via INTRAVENOUS
  Filled 2016-12-20: qty 10

## 2016-12-20 MED ORDER — ONDANSETRON HCL 4 MG/2ML IJ SOLN
INTRAMUSCULAR | Status: AC
  Filled 2016-12-20: qty 2

## 2016-12-20 MED ORDER — CHLORHEXIDINE GLUCONATE 4 % EX LIQD: 60.0000 mL | Freq: Once | CUTANEOUS | Status: DC

## 2016-12-20 MED ORDER — METHOCARBAMOL 500 MG PO TABS
500.0000 mg | ORAL_TABLET | Freq: Four times a day (QID) | ORAL | Status: DC | PRN
  Administered 2016-12-20 (×2): 500 mg via ORAL
  Filled 2016-12-20 (×2): qty 1

## 2016-12-20 MED ORDER — DIPHENHYDRAMINE HCL 12.5 MG/5ML PO ELIX: 12.5000 mg | ORAL_SOLUTION | ORAL | Status: DC | PRN

## 2016-12-20 MED ORDER — MIDAZOLAM HCL 2 MG/2ML IJ SOLN
INTRAMUSCULAR | Status: AC
  Filled 2016-12-20: qty 2

## 2016-12-20 MED ORDER — PROPOFOL 10 MG/ML IV BOLUS
INTRAVENOUS | Status: DC | PRN
  Administered 2016-12-20: 10 mg via INTRAVENOUS
  Administered 2016-12-20 (×3): 20 mg via INTRAVENOUS
  Administered 2016-12-20: 10 mg via INTRAVENOUS
  Administered 2016-12-20: 20 mg via INTRAVENOUS
  Administered 2016-12-20 (×2): 10 mg via INTRAVENOUS

## 2016-12-20 MED ORDER — METOCLOPRAMIDE HCL 5 MG/ML IJ SOLN: 5.0000 mg | Freq: Three times a day (TID) | INTRAMUSCULAR | Status: DC | PRN

## 2016-12-20 MED ORDER — OXYCODONE-ACETAMINOPHEN 5-325 MG PO TABS
ORAL_TABLET | ORAL | Status: AC
  Filled 2016-12-20: qty 1

## 2016-12-20 MED ORDER — ALUM & MAG HYDROXIDE-SIMETH 200-200-20 MG/5ML PO SUSP: 30.0000 mL | ORAL | Status: DC | PRN

## 2016-12-20 MED ORDER — METOCLOPRAMIDE HCL 5 MG PO TABS: 5.0000 mg | ORAL_TABLET | Freq: Three times a day (TID) | ORAL | Status: DC | PRN

## 2016-12-20 MED ORDER — SODIUM CHLORIDE 0.9 % IV SOLN: INTRAVENOUS | Status: DC

## 2016-12-20 MED ORDER — LIDOCAINE 2% (20 MG/ML) 5 ML SYRINGE
INTRAMUSCULAR | Status: DC | PRN
  Administered 2016-12-20: 40 mg via INTRAVENOUS

## 2016-12-20 MED ORDER — SODIUM CHLORIDE 0.9 % IJ SOLN
INTRAMUSCULAR | Status: AC
  Filled 2016-12-20: qty 50

## 2016-12-20 MED ORDER — ASPIRIN 81 MG PO CHEW
81.0000 mg | CHEWABLE_TABLET | Freq: Two times a day (BID) | ORAL | Status: DC
  Administered 2016-12-20 – 2016-12-21 (×2): 81 mg via ORAL
  Filled 2016-12-20 (×2): qty 1

## 2016-12-20 MED ORDER — OXYCODONE-ACETAMINOPHEN 5-325 MG PO TABS
1.0000 | ORAL_TABLET | ORAL | Status: DC | PRN
  Administered 2016-12-20 – 2016-12-21 (×4): 1 via ORAL
  Filled 2016-12-20 (×3): qty 1

## 2016-12-20 MED ORDER — PHENOL 1.4 % MT LIQD: 1.0000 | OROMUCOSAL | Status: DC | PRN

## 2016-12-20 MED ORDER — MIDAZOLAM HCL 5 MG/5ML IJ SOLN
INTRAMUSCULAR | Status: DC | PRN
  Administered 2016-12-20: 2 mg via INTRAVENOUS

## 2016-12-20 MED ORDER — KETOROLAC TROMETHAMINE 15 MG/ML IJ SOLN
15.0000 mg | Freq: Four times a day (QID) | INTRAMUSCULAR | Status: AC
  Administered 2016-12-20 – 2016-12-21 (×4): 15 mg via INTRAVENOUS
  Filled 2016-12-20 (×4): qty 1

## 2016-12-20 MED ORDER — HYDROMORPHONE HCL 1 MG/ML IJ SOLN: 0.5000 mg | INTRAMUSCULAR | Status: DC | PRN

## 2016-12-20 MED ORDER — SODIUM CHLORIDE 0.9 % IR SOLN
Status: DC | PRN
  Administered 2016-12-20: 4000 mL

## 2016-12-20 MED ORDER — FENTANYL CITRATE (PF) 100 MCG/2ML IJ SOLN
INTRAMUSCULAR | Status: AC
  Filled 2016-12-20: qty 2

## 2016-12-20 MED ORDER — DOCUSATE SODIUM 100 MG PO CAPS
100.0000 mg | ORAL_CAPSULE | Freq: Two times a day (BID) | ORAL | Status: DC
  Administered 2016-12-20 – 2016-12-21 (×2): 100 mg via ORAL
  Filled 2016-12-20 (×2): qty 1

## 2016-12-20 MED ORDER — TRANEXAMIC ACID 1000 MG/10ML IV SOLN
1000.0000 mg | INTRAVENOUS | Status: AC
  Administered 2016-12-20: 1000 mg via INTRAVENOUS
  Filled 2016-12-20: qty 1100

## 2016-12-20 MED ORDER — BUPIVACAINE-EPINEPHRINE 0.25% -1:200000 IJ SOLN
INTRAMUSCULAR | Status: DC | PRN
  Administered 2016-12-20: 30 mL

## 2016-12-20 MED ORDER — ISOPROPYL ALCOHOL 70 % SOLN
Status: DC | PRN
  Administered 2016-12-20: 1 via TOPICAL

## 2016-12-20 MED ORDER — DEXTROSE 5 % IV SOLN
500.0000 mg | Freq: Four times a day (QID) | INTRAVENOUS | Status: DC | PRN
  Administered 2016-12-20: 500 mg via INTRAVENOUS
  Filled 2016-12-20: qty 550

## 2016-12-20 MED ORDER — AMLODIPINE BESYLATE 5 MG PO TABS
5.0000 mg | ORAL_TABLET | Freq: Every day | ORAL | Status: DC
  Administered 2016-12-21: 5 mg via ORAL
  Filled 2016-12-20: qty 1

## 2016-12-20 MED ORDER — WATER FOR IRRIGATION, STERILE IR SOLN
Status: DC | PRN
  Administered 2016-12-20: 2000 mL

## 2016-12-20 MED ORDER — CEFAZOLIN SODIUM-DEXTROSE 2-4 GM/100ML-% IV SOLN
INTRAVENOUS | Status: AC
  Filled 2016-12-20: qty 100

## 2016-12-20 MED ORDER — KETOROLAC TROMETHAMINE 30 MG/ML IJ SOLN
INTRAMUSCULAR | Status: DC | PRN
  Administered 2016-12-20: 30 mg

## 2016-12-20 MED ORDER — HYDROCODONE-ACETAMINOPHEN 5-325 MG PO TABS: 2.0000 | ORAL_TABLET | ORAL | Status: DC | PRN

## 2016-12-20 MED ORDER — MENTHOL 3 MG MT LOZG: 1.0000 | LOZENGE | OROMUCOSAL | Status: DC | PRN

## 2016-12-20 MED ORDER — FENTANYL CITRATE (PF) 100 MCG/2ML IJ SOLN
INTRAMUSCULAR | Status: DC | PRN
  Administered 2016-12-20 (×2): 50 ug via INTRAVENOUS

## 2016-12-20 MED ORDER — KETOROLAC TROMETHAMINE 30 MG/ML IJ SOLN
INTRAMUSCULAR | Status: AC
  Filled 2016-12-20: qty 1

## 2016-12-20 MED ORDER — ONDANSETRON HCL 4 MG/2ML IJ SOLN
INTRAMUSCULAR | Status: DC | PRN
  Administered 2016-12-20: 4 mg via INTRAVENOUS

## 2016-12-20 MED ORDER — BUPIVACAINE IN DEXTROSE 0.75-8.25 % IT SOLN
INTRATHECAL | Status: DC | PRN
  Administered 2016-12-20: 2 mL via INTRATHECAL

## 2016-12-20 MED ORDER — ONDANSETRON HCL 4 MG/2ML IJ SOLN: 4.0000 mg | Freq: Four times a day (QID) | INTRAMUSCULAR | Status: DC | PRN

## 2016-12-20 MED ORDER — SODIUM CHLORIDE 0.9 % IJ SOLN
INTRAMUSCULAR | Status: DC | PRN
  Administered 2016-12-20: 29 mL via INTRAVENOUS

## 2016-12-20 MED ORDER — ISOPROPYL ALCOHOL 70 % SOLN
Status: AC
  Filled 2016-12-20: qty 480

## 2016-12-20 MED ORDER — POLYETHYLENE GLYCOL 3350 17 G PO PACK: 17.0000 g | PACK | Freq: Every day | ORAL | Status: DC | PRN

## 2016-12-20 MED ORDER — LACTATED RINGERS IV SOLN: INTRAVENOUS | Status: DC

## 2016-12-20 MED ORDER — ACETAMINOPHEN 650 MG RE SUPP: 650.0000 mg | RECTAL | Status: DC | PRN

## 2016-12-20 MED ORDER — PROPOFOL 500 MG/50ML IV EMUL
INTRAVENOUS | Status: DC | PRN
  Administered 2016-12-20: 50 ug/kg/min via INTRAVENOUS

## 2016-12-20 MED ORDER — LACTATED RINGERS IV SOLN
INTRAVENOUS | Status: DC | PRN
  Administered 2016-12-20 (×3): via INTRAVENOUS

## 2016-12-20 MED ORDER — CEFAZOLIN SODIUM-DEXTROSE 2-4 GM/100ML-% IV SOLN
2.0000 g | Freq: Four times a day (QID) | INTRAVENOUS | Status: AC
  Administered 2016-12-20 (×2): 2 g via INTRAVENOUS
  Filled 2016-12-20 (×2): qty 100

## 2016-12-20 MED ORDER — HYDROCODONE-ACETAMINOPHEN 5-325 MG PO TABS
ORAL_TABLET | ORAL | Status: AC
  Filled 2016-12-20: qty 2

## 2016-12-20 MED ORDER — ACETAMINOPHEN 10 MG/ML IV SOLN
1000.0000 mg | INTRAVENOUS | Status: AC
  Administered 2016-12-20: 1000 mg via INTRAVENOUS

## 2016-12-20 MED ORDER — CEFAZOLIN SODIUM-DEXTROSE 2-4 GM/100ML-% IV SOLN
2.0000 g | INTRAVENOUS | Status: AC
  Administered 2016-12-20: 2 g via INTRAVENOUS

## 2016-12-20 MED ORDER — ONDANSETRON HCL 4 MG PO TABS: 4.0000 mg | ORAL_TABLET | Freq: Four times a day (QID) | ORAL | Status: DC | PRN

## 2016-12-20 MED ORDER — ONDANSETRON HCL 4 MG/2ML IJ SOLN: 4.0000 mg | Freq: Once | INTRAMUSCULAR | Status: DC | PRN

## 2016-12-20 MED ORDER — 0.9 % SODIUM CHLORIDE (POUR BTL) OPTIME
TOPICAL | Status: DC | PRN
  Administered 2016-12-20: 1000 mL

## 2016-12-20 MED ORDER — PROPOFOL 10 MG/ML IV BOLUS
INTRAVENOUS | Status: AC
  Filled 2016-12-20: qty 60

## 2016-12-20 MED ORDER — DEXAMETHASONE SODIUM PHOSPHATE 10 MG/ML IJ SOLN
10.0000 mg | Freq: Once | INTRAMUSCULAR | Status: AC
  Administered 2016-12-21: 09:00:00 10 mg via INTRAVENOUS
  Filled 2016-12-20: qty 1

## 2016-12-20 SURGICAL SUPPLY — 53 items
BAG ZIPLOCK 12X15 (MISCELLANEOUS) ×3 IMPLANT
CAPT HIP TOTAL 2 ×3 IMPLANT
CHLORAPREP W/TINT 26ML (MISCELLANEOUS) ×3 IMPLANT
CLOTH BEACON ORANGE TIMEOUT ST (SAFETY) ×3 IMPLANT
COVER PERINEAL POST (MISCELLANEOUS) ×3 IMPLANT
COVER SURGICAL LIGHT HANDLE (MISCELLANEOUS) ×3 IMPLANT
DECANTER SPIKE VIAL GLASS SM (MISCELLANEOUS) ×3 IMPLANT
DERMABOND ADVANCED (GAUZE/BANDAGES/DRESSINGS) ×2
DERMABOND ADVANCED .7 DNX12 (GAUZE/BANDAGES/DRESSINGS) ×1 IMPLANT
DRAPE SHEET LG 3/4 BI-LAMINATE (DRAPES) ×9 IMPLANT
DRAPE STERI IOBAN 125X83 (DRAPES) ×3 IMPLANT
DRAPE U-SHAPE 47X51 STRL (DRAPES) ×6 IMPLANT
DRESSING AQUACEL AG SP 3.5X10 (GAUZE/BANDAGES/DRESSINGS) ×1 IMPLANT
DRSG AQUACEL AG ADV 3.5X10 (GAUZE/BANDAGES/DRESSINGS) ×3 IMPLANT
DRSG AQUACEL AG SP 3.5X10 (GAUZE/BANDAGES/DRESSINGS) ×3
ELECT PENCIL ROCKER SW 15FT (MISCELLANEOUS) ×3 IMPLANT
ELECT REM PT RETURN 15FT ADLT (MISCELLANEOUS) ×3 IMPLANT
GAUZE SPONGE 4X4 12PLY STRL (GAUZE/BANDAGES/DRESSINGS) ×3 IMPLANT
GLOVE BIO SURGEON STRL SZ 6.5 (GLOVE) ×2 IMPLANT
GLOVE BIO SURGEON STRL SZ8.5 (GLOVE) ×6 IMPLANT
GLOVE BIO SURGEONS STRL SZ 6.5 (GLOVE) ×1
GLOVE BIOGEL M STRL SZ7.5 (GLOVE) ×3 IMPLANT
GLOVE BIOGEL PI IND STRL 6.5 (GLOVE) ×1 IMPLANT
GLOVE BIOGEL PI IND STRL 7.5 (GLOVE) ×5 IMPLANT
GLOVE BIOGEL PI IND STRL 8.5 (GLOVE) ×1 IMPLANT
GLOVE BIOGEL PI INDICATOR 6.5 (GLOVE) ×2
GLOVE BIOGEL PI INDICATOR 7.5 (GLOVE) ×10
GLOVE BIOGEL PI INDICATOR 8.5 (GLOVE) ×2
GLOVE SURG SS PI 7.5 STRL IVOR (GLOVE) ×6 IMPLANT
GOWN SPEC L3 XXLG W/TWL (GOWN DISPOSABLE) ×6 IMPLANT
GOWN STRL REUS W/ TWL XL LVL3 (GOWN DISPOSABLE) ×1 IMPLANT
GOWN STRL REUS W/TWL LRG LVL3 (GOWN DISPOSABLE) ×3 IMPLANT
GOWN STRL REUS W/TWL XL LVL3 (GOWN DISPOSABLE) ×5 IMPLANT
HANDPIECE INTERPULSE COAX TIP (DISPOSABLE) ×2
HOLDER FOLEY CATH W/STRAP (MISCELLANEOUS) ×3 IMPLANT
HOOD PEEL AWAY FLYTE STAYCOOL (MISCELLANEOUS) ×12 IMPLANT
MARKER SKIN DUAL TIP RULER LAB (MISCELLANEOUS) ×3 IMPLANT
NEEDLE SPNL 18GX3.5 QUINCKE PK (NEEDLE) ×3 IMPLANT
PACK ANTERIOR HIP CUSTOM (KITS) ×3 IMPLANT
SAW OSC TIP CART 19.5X105X1.3 (SAW) ×3 IMPLANT
SEALER BIPOLAR AQUA 6.0 (INSTRUMENTS) ×3 IMPLANT
SET HNDPC FAN SPRY TIP SCT (DISPOSABLE) ×1 IMPLANT
SUT ETHIBOND NAB CT1 #1 30IN (SUTURE) ×6 IMPLANT
SUT MNCRL AB 3-0 PS2 18 (SUTURE) ×3 IMPLANT
SUT MON AB 2-0 CT1 36 (SUTURE) ×6 IMPLANT
SUT STRATAFIX PDO 1 14 VIOLET (SUTURE) ×2
SUT STRATFX PDO 1 14 VIOLET (SUTURE) ×1
SUT VIC AB 2-0 CT1 27 (SUTURE) ×2
SUT VIC AB 2-0 CT1 TAPERPNT 27 (SUTURE) ×1 IMPLANT
SUTURE STRATFX PDO 1 14 VIOLET (SUTURE) ×1 IMPLANT
SYR 50ML LL SCALE MARK (SYRINGE) ×3 IMPLANT
TRAY FOLEY W/METER SILVER 16FR (SET/KITS/TRAYS/PACK) ×3 IMPLANT
YANKAUER SUCT BULB TIP 10FT TU (MISCELLANEOUS) ×3 IMPLANT

## 2016-12-20 NOTE — Anesthesia Procedure Notes (Signed)
Spinal  Start time: 12/20/2016 7:49 AM End time: 12/20/2016 7:52 AM Staffing Anesthesiologist: Leonides GrillsEllender, Ryan P, MD Resident/CRNA: Epimenio SarinJarvela, Lemon Whitacre R, CRNA Performed: resident/CRNA  Preanesthetic Checklist Completed: patient identified, surgical consent, pre-op evaluation, timeout performed, IV checked, risks and benefits discussed and monitors and equipment checked Spinal Block Patient position: sitting Prep: DuraPrep Patient monitoring: heart rate, cardiac monitor, continuous pulse ox and blood pressure Approach: midline Location: L3-4 Injection technique: single-shot Needle Needle type: Pencan  Needle gauge: 24 G Needle length: 10 cm Needle insertion depth: 7 cm Assessment Sensory level: T6

## 2016-12-20 NOTE — Discharge Instructions (Signed)
°Dr. Dajsha Massaro °Joint Replacement Specialist °[facility] °[address] °[place], [place] [address] °[phone] ° ° °TOTAL HIP REPLACEMENT POSTOPERATIVE DIRECTIONS ° ° ° °Hip Rehabilitation, Guidelines Following Surgery  ° °WEIGHT BEARING °Weight bearing as tolerated with assist device (walker, cane, etc) as directed, use it as long as suggested by your surgeon or therapist, typically at least 4-6 weeks. ° °The results of a hip operation are greatly improved after range of motion and muscle strengthening exercises. Follow all safety measures which are given to protect your hip. If any of these exercises cause increased pain or swelling in your joint, decrease the amount until you are comfortable again. Then slowly increase the exercises. Call your caregiver if you have problems or questions.  ° °HOME CARE INSTRUCTIONS  °Most of the following instructions are designed to prevent the dislocation of your new hip.  °Remove items at home which could result in a fall. This includes throw rugs or furniture in walking pathways.  °Continue medications as instructed at time of discharge. °· You may have some home medications which will be placed on hold until you complete the course of blood thinner medication. °· You may start showering once you are discharged home. Do not remove your dressing. °Do not put on socks or shoes without following the instructions of your caregivers.   °Sit on chairs with arms. Use the chair arms to help push yourself up when arising.  °Arrange for the use of a toilet seat elevator so you are not sitting low.  °· Walk with walker as instructed.  °You may resume a sexual relationship in one month or when given the OK by your caregiver.  °Use walker as long as suggested by your caregivers.  °You may put full weight on your legs and walk as much as is comfortable. °Avoid periods of inactivity such as sitting longer than an hour when not asleep. This helps prevent  blood clots.  °You may return to work once you are cleared by your surgeon.  °Do not drive a car for 6 weeks or until released by your surgeon.  °Do not drive while taking narcotics.  °Wear elastic stockings for two weeks following surgery during the day but you may remove then at night.  °Make sure you keep all of your appointments after your operation with all of your doctors and caregivers. You should call the office at the above phone number and make an appointment for approximately two weeks after the date of your surgery. °Please pick up a stool softener and laxative for home use as long as you are requiring pain medications. °· ICE to the affected hip every three hours for 30 minutes at a time and then as needed for pain and swelling. Continue to use ice on the hip for pain and swelling from surgery. You may notice swelling that will progress down to the foot and ankle.  This is normal after surgery.  Elevate the leg when you are not up walking on it.   °It is important for you to complete the blood thinner medication as prescribed by your doctor. °· Continue to use the breathing machine which will help keep your temperature down.  It is common for your temperature to cycle up and down following surgery, especially at night when you are not up moving around and exerting yourself.  The breathing machine keeps your lungs expanded and your temperature down. ° °RANGE OF MOTION AND STRENGTHENING EXERCISES  °These exercises are   designed to help you keep full movement of your hip joint. Follow your caregiver's or physical therapist's instructions. Perform all exercises about fifteen times, three times per day or as directed. Exercise both hips, even if you have had only one joint replacement. These exercises can be done on a training (exercise) mat, on the floor, on a table or on a bed. Use whatever works the best and is most comfortable for you. Use music or television while you are exercising so that the exercises  are a pleasant break in your day. This will make your life better with the exercises acting as a break in routine you can look forward to.  °Lying on your back, slowly slide your foot toward your buttocks, raising your knee up off the floor. Then slowly slide your foot back down until your leg is straight again.  °Lying on your back spread your legs as far apart as you can without causing discomfort.  °Lying on your side, raise your upper leg and foot straight up from the floor as far as is comfortable. Slowly lower the leg and repeat.  °Lying on your back, tighten up the muscle in the front of your thigh (quadriceps muscles). You can do this by keeping your leg straight and trying to raise your heel off the floor. This helps strengthen the largest muscle supporting your knee.  °Lying on your back, tighten up the muscles of your buttocks both with the legs straight and with the knee bent at a comfortable angle while keeping your heel on the floor.  ° °SKILLED REHAB INSTRUCTIONS: °If the patient is transferred to a skilled rehab facility following release from the hospital, a list of the current medications will be sent to the facility for the patient to continue.  When discharged from the skilled rehab facility, please have the facility set up the patient's Home Health Physical Therapy prior to being released. Also, the skilled facility will be responsible for providing the patient with their medications at time of release from the facility to include their pain medication and their blood thinner medication. If the patient is still at the rehab facility at time of the two week follow up appointment, the skilled rehab facility will also need to assist the patient in arranging follow up appointment in our office and any transportation needs. ° °MAKE SURE YOU:  °Understand these instructions.  °Will watch your condition.  °Will get help right away if you are not doing well or get worse. ° °Pick up stool softner and  laxative for home use following surgery while on pain medications. °Do not remove your dressing. °The dressing is waterproof--it is OK to take showers. °Continue to use ice for pain and swelling after surgery. °Do not use any lotions or creams on the incision until instructed by your surgeon. °Total Hip Protocol. ° ° °

## 2016-12-20 NOTE — Interval H&P Note (Signed)
History and Physical Interval Note:  12/20/2016 7:38 AM  William Higgins Slight  has presented today for surgery, with the diagnosis of Right hip degenerative joint disease  The various methods of treatment have been discussed with the patient and family. After consideration of risks, benefits and other options for treatment, the patient has consented to  Procedure(s) with comments: RIGHT TOTAL HIP ARTHROPLASTY ANTERIOR APPROACH (Right) - Needs RNFA as a surgical intervention .  The patient's history has been reviewed, patient examined, no change in status, stable for surgery.  I have reviewed the patient's chart and labs.  Questions were answered to the patient's satisfaction.     Iline OvenBrian J Jontay Maston

## 2016-12-20 NOTE — Anesthesia Postprocedure Evaluation (Signed)
Anesthesia Post Note  Patient: William BustardDavid Higgins  Procedure(s) Performed: RIGHT TOTAL HIP ARTHROPLASTY ANTERIOR APPROACH (Right Hip)     Patient location during evaluation: PACU Anesthesia Type: Spinal Level of consciousness: oriented and awake and alert Pain management: pain level controlled Vital Signs Assessment: post-procedure vital signs reviewed and stable Respiratory status: spontaneous breathing, respiratory function stable and patient connected to nasal cannula oxygen Cardiovascular status: blood pressure returned to baseline and stable Postop Assessment: no headache, no backache, no apparent nausea or vomiting and spinal receding Anesthetic complications: no    Last Vitals:  Vitals:   12/20/16 1430 12/20/16 1452  BP: (!) 133/91 (!) 123/97  Pulse: 76 79  Resp: 11 12  Temp: 36.8 C 37.2 C  SpO2: 98% 99%    Last Pain:  Vitals:   12/20/16 0603  TempSrc: Oral                 Ryan P Ellender

## 2016-12-20 NOTE — Evaluation (Signed)
Physical Therapy Evaluation Patient Details Name: William Higgins MRN: [id number] DOB: 06/11/1955 Today's Date: 12/20/2016   History of Present Illness  Pt s/p R THR  Clinical Impression  Pt s/p R THR and presents with decreased R LE strength/ROM and post op pain limiting functional mobility.  Pt should progress to dc home with family assist.    Follow Up Recommendations DC plan and follow up therapy as arranged by surgeon    Equipment Recommendations  None recommended by PT    Recommendations for Other Services OT consult     Precautions / Restrictions Precautions Precautions: Fall Restrictions Weight Bearing Restrictions: No Other Position/Activity Restrictions: WBAT      Mobility  Bed Mobility Overal bed mobility: Needs Assistance Bed Mobility: Supine to Sit     Supine to sit: Min guard     General bed mobility comments: cues for use of L LE to self assist and to SLOW down  Transfers Overall transfer level: Needs assistance   Transfers: Sit to/from Stand Sit to Stand: Min assist         General transfer comment: cues for LE management and use of UEs to self assist  Ambulation/Gait Ambulation/Gait assistance: Min guard Ambulation Distance (Feet): 90 Feet Assistive device: Rolling walker (2 wheeled) Gait Pattern/deviations: Step-to pattern;Decreased step length - right;Decreased step length - left;Shuffle;Trunk flexed Gait velocity: decr Gait velocity interpretation: Below normal speed for age/gender General Gait Details: cues for posture, position from RW and initial sequence  Stairs            Wheelchair Mobility    Modified Rankin (Stroke Patients Only)       Balance Overall balance assessment: No apparent balance deficits (not formally assessed)                                           Pertinent Vitals/Pain Pain Assessment: 0-10 Pain Score: 5  Pain Location: R hip Pain Descriptors / Indicators: Aching;Sore Pain  Intervention(s): Limited activity within patient's tolerance;Monitored during session;Premedicated before session;Ice applied;Patient requesting pain meds-RN notified    Home Living Family/patient expects to be discharged to:: Private residence Living Arrangements: Spouse/significant other;Children Available Help at Discharge: Family Type of Home: House Home Access: Stairs to enter Entrance Stairs-Rails: Doctor, general practiceight;Left Entrance Stairs-Number of Steps: 3 Home Layout: One level Home Equipment: Environmental consultantWalker - 2 wheels;Tub bench      Prior Function Level of Independence: Independent               Hand Dominance        Extremity/Trunk Assessment   Upper Extremity Assessment Upper Extremity Assessment: Overall WFL for tasks assessed    Lower Extremity Assessment Lower Extremity Assessment: RLE deficits/detail    Cervical / Trunk Assessment Cervical / Trunk Assessment: Normal  Communication   Communication: No difficulties  Cognition Arousal/Alertness: Awake/alert Behavior During Therapy: WFL for tasks assessed/performed Overall Cognitive Status: Within Functional Limits for tasks assessed                                        General Comments      Exercises Total Joint Exercises Ankle Circles/Pumps: AROM;Both;15 reps;Supine   Assessment/Plan    PT Assessment Patient needs continued PT services  PT Problem List Decreased strength;Decreased range of motion;Decreased activity tolerance;Decreased mobility;Decreased knowledge  of use of DME;Pain       PT Treatment Interventions DME instruction;Gait training;Stair training;Functional mobility training;Therapeutic activities;Therapeutic exercise;Patient/family education    PT Goals (Current goals can be found in the Care Plan section)  Acute Rehab PT Goals Patient Stated Goal: Regain IND and get back to the gym PT Goal Formulation: With patient Time For Goal Achievement: 12/22/16 Potential to Achieve  Goals: Good    Frequency 7X/week   Barriers to discharge        Co-evaluation               AM-PAC PT "6 Clicks" Daily Activity  Outcome Measure Difficulty turning over in bed (including adjusting bedclothes, sheets and blankets)?: A Lot Difficulty moving from lying on back to sitting on the side of the bed? : A Lot Difficulty sitting down on and standing up from a chair with arms (e.g., wheelchair, bedside commode, etc,.)?: A Lot Help needed moving to and from a bed to chair (including a wheelchair)?: A Little Help needed walking in hospital room?: A Little Help needed climbing 3-5 steps with a railing? : A Little 6 Click Score: 15    End of Session Equipment Utilized During Treatment: Gait belt Activity Tolerance: Patient tolerated treatment well Patient left: in chair;with call bell/phone within reach;with family/visitor present Nurse Communication: Mobility status PT Visit Diagnosis: Difficulty in walking, not elsewhere classified (R26.2)    Time: [id number] PT Time Calculation (min) (ACUTE ONLY): 34 min   Charges:   PT Evaluation $PT Eval Low Complexity: 1 Low PT Treatments $Gait Training: 8-22 mins   PT G Codes:        Pg [phone]   Jodie Leiner 12/20/2016, 5:09 PM

## 2016-12-20 NOTE — Transfer of Care (Signed)
Immediate Anesthesia Transfer of Care Note  Patient: William BustardDavid Higgins  Procedure(s) Performed: RIGHT TOTAL HIP ARTHROPLASTY ANTERIOR APPROACH (Right Hip)  Patient Location: PACU  Anesthesia Type:Spinal  Level of Consciousness: drowsy and patient cooperative  Airway & Oxygen Therapy: Patient Spontanous Breathing and Patient connected to face mask  Post-op Assessment: Report given to RN and Post -op Vital signs reviewed and stable  Post vital signs: Reviewed and stable  Last Vitals:  Vitals:   12/20/16 0603  BP: (!) 148/96  Pulse: 78  Resp: 16  Temp: 36.9 C  SpO2: 100%    Last Pain:  Vitals:   12/20/16 0603  TempSrc: Oral         Complications: No apparent anesthesia complications

## 2016-12-20 NOTE — Op Note (Signed)
OPERATIVE REPORT  SURGEON: Samson FredericBrian Lowry Bala, MD   ASSISTANT: Skip MayerBlair Roberts, PA-C.  PREOPERATIVE DIAGNOSIS: Right hip arthritis.   POSTOPERATIVE DIAGNOSIS: Right hip arthritis.   PROCEDURE: Right total hip arthroplasty, anterior approach.   IMPLANTS: DePuy Tri Lock stem, size 6, hi offset. DePuy Pinnacle Cup, size 58 mm. DePuy Altrx liner, size 36 by 58 mm, +4 neutral. DePuy Biolox ceramic head ball, size 36 + 5 mm.  ANESTHESIA:  Spinal  ESTIMATED BLOOD LOSS:-250 mL    ANTIBIOTICS: 2 g Ancef.  DRAINS: None.  COMPLICATIONS: None.   CONDITION: PACU - hemodynamically stable.   BRIEF CLINICAL NOTE: William Higgins is a 61 y.o. male with a long-standing history of Right hip arthritis. After failing conservative management, the patient was indicated for total hip arthroplasty. The risks, benefits, and alternatives to the procedure were explained, and the patient elected to proceed.  PROCEDURE IN DETAIL: Surgical site was marked by myself in the pre-op holding area. Once inside the operating room, spinal anesthesia was obtained, and a foley catheter was inserted. The patient was then positioned on the Hana table. All bony prominences were well padded. The hip was prepped and draped in the normal sterile surgical fashion. A time-out was called verifying side and site of surgery. The patient received IV antibiotics within 60 minutes of beginning the procedure.  The direct anterior approach to the hip was performed through the Hueter interval. Lateral femoral circumflex vessels were treated with the Auqumantys. The anterior capsule was exposed and an inverted T capsulotomy was made.The femoral neck cut was made to the level of the templated cut. A corkscrew was placed into the head and the head was removed. The femoral head was found to have eburnated bone. The head was passed to the back table and was measured.  Acetabular exposure was achieved, and the pulvinar and labrum were  excised. Sequential reaming of the acetabulum was then performed up to a size 57 mm reamer. A 58 mm cup was then opened and impacted into place at approximately 40 degrees of abduction and 20 degrees of anteversion. The final polyethylene liner was impacted into place and acetabular osteophytes were removed.   I then gained femoral exposure taking care to protect the abductors and greater trochanter. This was performed using standard external rotation, extension, and adduction. The capsule was peeled off the inner aspect of the greater trochanter, taking care to preserve the short external rotators. A cookie Feagan was used to enter the femoral canal, and then the femoral canal finder was placed. Sequential broaching was performed up to a size 6. Calcar planer was used on the femoral neck remnant. I placed a hi offset neck and a trial head ball. The hip was reduced. Leg lengths and offset were checked fluoroscopically. The hip was dislocated and trial components were removed. The final implants were placed, and the hip was reduced.  Fluoroscopy was used to confirm component position and leg lengths. At 90 degrees of external rotation and full extension, the hip was stable to an anterior directed force.  The wound was copiously irrigated with normal saline using pulse lavage. Marcaine solution was injected into the periarticular soft tissue. The wound was closed in layers using #1 Vicryl and V-Loc for the fascia, 2-0 Vicryl for the subcutaneous fat, 2-0 Monocryl for the deep dermal layer, 3-0 running Monocryl subcuticular stitch, and Dermabond for the skin. Once the glue was fully dried, an Aquacell Ag dressing was applied. The patient was transported to the recovery room  in stable condition. Sponge, needle, and instrument counts were correct at the end of the case x2. The patient tolerated the procedure well and there were no known complications.  Please note that a surgical assistant was a  medical necessity for this procedure to perform it in a safe and expeditious manner. Assistant was necessary to provide appropriate retraction of vital neurovascular structures, to prevent femoral fracture, and to allow for anatomic placement of the prosthesis.

## 2016-12-21 ENCOUNTER — Encounter (HOSPITAL_COMMUNITY): Payer: Self-pay | Admitting: Orthopedic Surgery

## 2016-12-21 LAB — BASIC METABOLIC PANEL
Anion gap: 5 (ref 5–15)
BUN: 20 mg/dL (ref 6–20)
CHLORIDE: 104 mmol/L (ref 101–111)
CO2: 27 mmol/L (ref 22–32)
Calcium: 8.5 mg/dL — ABNORMAL LOW (ref 8.9–10.3)
Creatinine, Ser: 0.89 mg/dL (ref 0.61–1.24)
GFR calc Af Amer: >60 mL/min (ref >60)
GFR calc non Af Amer: >60 mL/min (ref >60)
GLUCOSE: 106 mg/dL — AB (ref 65–99)
POTASSIUM: 4.4 mmol/L (ref 3.5–5.1)
Sodium: 136 mmol/L (ref 135–145)

## 2016-12-21 LAB — CBC
HEMATOCRIT: 34.4 % — AB (ref 39.0–52.0)
HEMOGLOBIN: 11.8 g/dL — AB (ref 13.0–17.0)
MCH: 29.9 pg (ref 26.0–34.0)
MCHC: 34.3 g/dL (ref 30.0–36.0)
MCV: 87.1 fL (ref 78.0–100.0)
Platelets: 154 10*3/uL (ref 150–400)
RBC: 3.95 MIL/uL — AB (ref 4.22–5.81)
RDW: 12.3 % (ref 11.5–15.5)
WBC: 9.6 10*3/uL (ref 4.0–10.5)

## 2016-12-21 MED ORDER — DOCUSATE SODIUM 100 MG PO CAPS: 100.0000 mg | ORAL_CAPSULE | Freq: Two times a day (BID) | ORAL | 1 refills | Status: AC

## 2016-12-21 MED ORDER — OXYCODONE-ACETAMINOPHEN 5-325 MG PO TABS: 1.0000 | ORAL_TABLET | Freq: Four times a day (QID) | ORAL | 0 refills | Status: AC | PRN

## 2016-12-21 MED ORDER — ASPIRIN 81 MG PO CHEW: 81.0000 mg | CHEWABLE_TABLET | Freq: Two times a day (BID) | ORAL | 1 refills | Status: AC

## 2016-12-21 MED ORDER — SENNA 8.6 MG PO TABS: 2.0000 | ORAL_TABLET | Freq: Every day | ORAL | 0 refills | Status: AC

## 2016-12-21 MED ORDER — ONDANSETRON HCL 4 MG PO TABS: 4.0000 mg | ORAL_TABLET | Freq: Four times a day (QID) | ORAL | 0 refills | Status: AC | PRN

## 2016-12-21 NOTE — Discharge Summary (Signed)
Physician Discharge Summary  Patient ID: William Higgins MRN: [id number] DOB/AGE: 59-Aug-1957 59 y.o.  Admit date: 12/20/2016 Discharge date: 12/21/2016  Admission Diagnoses:  Osteoarthritis of right hip  Discharge Diagnoses:  Principal Problem:   Osteoarthritis of right hip   Past Medical History:  Diagnosis Date  . Arthritis    oa  . Chronic kidney disease    chronic kidney disease followed by medical md stage 3  . Hypertension     Surgeries: Procedure(s): RIGHT TOTAL HIP ARTHROPLASTY ANTERIOR APPROACH on 12/20/2016   Consultants (if any):   Discharged Condition: Improved  Hospital Course: William BustardDavid Higgins is an 61 y.o. male who was admitted 12/20/2016 with a diagnosis of Osteoarthritis of right hip and went to the operating room on 12/20/2016 and underwent the above named procedures.    He was given perioperative antibiotics:  Anti-infectives (From admission, onward)   Start     Dose/Rate Route Frequency Ordered Stop   12/20/16 1500  ceFAZolin (ANCEF) IVPB 2g/100 mL premix     2 g 200 mL/hr over 30 Minutes Intravenous Every 6 hours 12/20/16 1459 12/20/16 2157   12/20/16 0658  ceFAZolin (ANCEF) 2-4 GM/100ML-% IVPB    Comments:  Jarvis NewcomerArmistead, Lacey   : cabinet override      12/20/16 0658 12/20/16 0755   12/20/16 0558  ceFAZolin (ANCEF) IVPB 2g/100 mL premix     2 g 200 mL/hr over 30 Minutes Intravenous On call to O.R. 12/20/16 0558 12/20/16 0800    .  He was given sequential compression devices, early ambulation, and ASA for DVT prophylaxis.  He benefited maximally from the hospital stay and there were no complications.    Recent vital signs:  Vitals:   12/21/16 0200 12/21/16 0543  BP: 128/85 132/87  Pulse: 66 75  Resp: 13 13  Temp: 98.6 F (37 C) 97.6 F (36.4 C)  SpO2: 97% 96%    Recent laboratory studies:  Lab Results  Component Value Date   HGB 13.9 12/19/2016   HGB 14.9 08/26/2012   HGB 15.7 04/13/2010   Lab Results  Component Value Date   WBC 5.6  12/19/2016   PLT 161 12/19/2016   No results found for: INR Lab Results  Component Value Date   NA 136 12/21/2016   K 4.4 12/21/2016   CL 104 12/21/2016   CO2 27 12/21/2016   BUN 20 12/21/2016   CREATININE 0.89 12/21/2016   GLUCOSE 106 (H) 12/21/2016    Discharge Medications:   Allergies as of 12/21/2016   No Known Allergies     Medication List    STOP taking these medications   acetaminophen 500 MG tablet Commonly known as:  TYLENOL   BLUE-EMU MAXIMUM STRENGTH EX     TAKE these medications   amLODipine 5 MG tablet Commonly known as:  NORVASC Take 5 mg by mouth daily.   aspirin 81 MG chewable tablet Chew 1 tablet (81 mg total) by mouth 2 (two) times daily.   celecoxib 200 MG capsule Commonly known as:  CELEBREX Take 200 mg by mouth daily.   docusate sodium 100 MG capsule Commonly known as:  COLACE Take 1 capsule (100 mg total) by mouth 2 (two) times daily.   EQL VITAMIN D3 2000 units Caps Generic drug:  Cholecalciferol Take 2,000 Units by mouth daily.   losartan 25 MG tablet Commonly known as:  COZAAR Take 25 mg by mouth daily.   ondansetron 4 MG tablet Commonly known as:  ZOFRAN Take 1 tablet (4  mg total) by mouth every 6 (six) hours as needed for nausea.   oxyCODONE-acetaminophen 5-325 MG tablet Commonly known as:  PERCOCET/ROXICET Take 1-2 tablets by mouth every 6 (six) hours as needed (hip pain).   senna 8.6 MG Tabs tablet Commonly known as:  SENOKOT Take 2 tablets (17.2 mg total) by mouth at bedtime.   SUPER B COMPLEX PO Take 1 tablet by mouth daily.   Turmeric 500 MG Caps Take 500 mg by mouth 2 (two) times daily.            Durable Medical Equipment  (From admission, onward)        Start     Ordered   12/20/16 1500  DME Walker rolling  Once    Question:  Patient needs a walker to treat with the following condition  Answer:  Status post total replacement of right hip   12/20/16 1459   12/20/16 1500  DME 3 n 1  Once      12/20/16 1459      Diagnostic Studies: Dg Pelvis Portable  Result Date: 12/20/2016 CLINICAL DATA:  61 year old male status post right hip replacement. EXAM: DG C-ARM 61-120 MIN-NO REPORT; PORTABLE PELVIS 1-2 VIEWS COMPARISON:  07/28/2016. FINDINGS: Multilevel intraoperative spot views of the pelvis document placement of right hip arthroplasty. Single portable view of the pelvis postoperatively demonstrates a new right hip prosthesis. Both the femoral and acetabular components of the prosthesis appear properly located without definite periprosthetic fracture or other immediate complicating features. Gas is noted in the joint space and the overlying soft tissues. IMPRESSION: 1. Expected postoperative findings of right hip arthroplasty, as above. Electronically Signed   By: Trudie Reedaniel  Entrikin M.D.   On: 12/20/2016 10:38   Dg C-arm 61-120 Min-no Report  Result Date: 12/20/2016 Fluoroscopy was utilized by the requesting physician.  No radiographic interpretation.   Dg Hip Operative Unilat W Or W/o Pelvis Right  Result Date: 12/20/2016 CLINICAL DATA:  Right hip replacement EXAM: OPERATIVE RIGHT HIP WITH PELVIS COMPARISON:  07/23/2016 FLUOROSCOPY TIME:  Radiation Exposure Index (as provided by the fluoroscopic device): 1.87 mGy If the device does not provide the exposure index: Fluoroscopy Time:  21 seconds Number of Acquired Images:  4 FINDINGS: Right hip replacement is now seen in satisfactory position. No acute bony abnormality is noted. IMPRESSION: Status post right hip replacement Electronically Signed   By: Alcide CleverMark  Lukens M.D.   On: 12/20/2016 09:41    Disposition: 01-Home or Self Care  Discharge Instructions    Call MD / Call 911   Complete by:  As directed    If you experience chest pain or shortness of breath, CALL 911 and be transported to the hospital emergency room.  If you develope a fever above 101 F, pus (white drainage) or increased drainage or redness at the wound, or calf pain, call  your surgeon's office.   Constipation Prevention   Complete by:  As directed    Drink plenty of fluids.  Prune juice may be helpful.  You may use a stool softener, such as Colace (over the counter) 100 mg twice a day.  Use MiraLax (over the counter) for constipation as needed.   Diet - low sodium heart healthy   Complete by:  As directed    Driving restrictions   Complete by:  As directed    No driving for 6 weeks   Increase activity slowly as tolerated   Complete by:  As directed    Lifting restrictions  Complete by:  As directed    No lifting for 6 weeks   TED hose   Complete by:  As directed    Use stockings (TED hose) for 2 weeks on both leg(s).  You may remove them at night for sleeping.      Follow-up Information    Refugio Vandevoorde, Arlys John, MD. Schedule an appointment as soon as possible for a visit in 2 weeks.   Specialty:  Orthopedic Surgery Why:  For wound re-check Contact information: [address] [place] [address] [phone]            Signed: Iline Oven Ganesh Deeg 12/21/2016, 7:02 AM

## 2016-12-21 NOTE — Progress Notes (Signed)
Physical Therapy Treatment Patient Details Name: William BustardDavid Higgins MRN: [id number] DOB: May 16, 1955 Today's Date: 12/21/2016    History of Present Illness Pt s/p R THR    PT Comments    Pt progressing well with mobility and eager for dc home this pm.   Follow Up Recommendations  DC plan and follow up therapy as arranged by surgeon     Equipment Recommendations  None recommended by PT    Recommendations for Other Services OT consult     Precautions / Restrictions Precautions Precautions: Fall Restrictions Weight Bearing Restrictions: No Other Position/Activity Restrictions: WBAT    Mobility  Bed Mobility Overal bed mobility: Needs Assistance Bed Mobility: Supine to Sit     Supine to sit: Min guard     General bed mobility comments: cues for use of L LE to self assist and to SLOW down  Transfers Overall transfer level: Needs assistance Equipment used: Rolling walker (2 wheeled) Transfers: Sit to/from Stand Sit to Stand: Min guard         General transfer comment: cues for LE management and use of UEs to self assist  Ambulation/Gait Ambulation/Gait assistance: Min guard Ambulation Distance (Feet): 200 Feet Assistive device: Rolling walker (2 wheeled) Gait Pattern/deviations: Decreased step length - right;Decreased step length - left;Shuffle;Trunk flexed;Step-to pattern;Step-through pattern Gait velocity: decr Gait velocity interpretation: Below normal speed for age/gender General Gait Details: cues for posture, position from RW and initial sequence   Stairs            Wheelchair Mobility    Modified Rankin (Stroke Patients Only)       Balance Overall balance assessment: No apparent balance deficits (not formally assessed)                                          Cognition Arousal/Alertness: Awake/alert Behavior During Therapy: WFL for tasks assessed/performed Overall Cognitive Status: Within Functional Limits for tasks  assessed                                        Exercises Total Joint Exercises Ankle Circles/Pumps: AROM;Both;15 reps;Supine Quad Sets: AROM;Both;10 reps;Supine Heel Slides: AAROM;Right;20 reps;Supine Hip ABduction/ADduction: AAROM;Right;15 reps;Supine    General Comments        Pertinent Vitals/Pain Pain Assessment: 0-10 Pain Score: 3  Pain Location: R hip Pain Descriptors / Indicators: Aching;Sore Pain Intervention(s): Limited activity within patient's tolerance;Monitored during session;Premedicated before session;Ice applied    Home Living                      Prior Function            PT Goals (current goals can now be found in the care plan section) Acute Rehab PT Goals Patient Stated Goal: Regain IND and get back to the gym PT Goal Formulation: With patient Time For Goal Achievement: 12/22/16 Potential to Achieve Goals: Good Progress towards PT goals: Progressing toward goals    Frequency    7X/week      PT Plan Current plan remains appropriate    Co-evaluation              AM-PAC PT "6 Clicks" Daily Activity  Outcome Measure  Difficulty turning over in bed (including adjusting bedclothes, sheets and blankets)?: A Lot Difficulty moving from  lying on back to sitting on the side of the bed? : A Lot Difficulty sitting down on and standing up from a chair with arms (e.g., wheelchair, bedside commode, etc,.)?: A Lot Help needed moving to and from a bed to chair (including a wheelchair)?: A Little Help needed walking in hospital room?: A Little Help needed climbing 3-5 steps with a railing? : A Little 6 Click Score: 15    End of Session Equipment Utilized During Treatment: Gait belt Activity Tolerance: Patient tolerated treatment well Patient left: in chair;with call bell/phone within reach;with family/visitor present Nurse Communication: Mobility status PT Visit Diagnosis: Difficulty in walking, not elsewhere classified  (R26.2)     Time: [id number] PT Time Calculation (min) (ACUTE ONLY): 32 min  Charges:  $Gait Training: 8-22 mins $Therapeutic Exercise: 8-22 mins                    G Codes:       Pg [phone]    Rechelle Niebla 12/21/2016, 12:55 PM

## 2016-12-21 NOTE — Progress Notes (Signed)
Patient discharged to home with wife.Given all belongings, instructions, prescriptions. Patient and wife both verbalized understanding of instructions. Escorted to pov via w/c.

## 2016-12-21 NOTE — Progress Notes (Signed)
   Subjective:  Patient reports pain as mild to moderate.  Denies N/V/CP/SOB.  Objective:   VITALS:   Vitals:   12/20/16 1834 12/20/16 2211 12/21/16 0200 12/21/16 0543  BP: (!) 128/99 132/88 128/85 132/87  Pulse: 75 73 66 75  Resp: 12 12 13 13   Temp: 97.7 F (36.5 C) 98.8 F (37.1 C) 98.6 F (37 C) 97.6 F (36.4 C)  TempSrc: Oral Oral Oral Oral  SpO2: 100% 96% 97% 96%  Weight:      Height:       NAD ABD soft Sensation intact distally Intact pulses distally Dorsiflexion/Plantar flexion intact Incision: dressing C/D/I Compartment soft   Lab Results  Component Value Date   WBC 5.6 12/19/2016   HGB 13.9 12/19/2016   HCT 40.2 12/19/2016   MCV 87.0 12/19/2016   PLT 161 12/19/2016   BMET    Component Value Date/Time   NA 136 12/21/2016 0551   K 4.4 12/21/2016 0551   CL 104 12/21/2016 0551   CO2 27 12/21/2016 0551   GLUCOSE 106 (H) 12/21/2016 0551   BUN 20 12/21/2016 0551   CREATININE 0.89 12/21/2016 0551   CALCIUM 8.5 (L) 12/21/2016 0551   GFRNONAA >60 12/21/2016 0551   GFRAA >60 12/21/2016 0551     Assessment/Plan: 1 Day Post-Op   Principal Problem:   Osteoarthritis of right hip   WBAT with walker DVT ppx: ASA, SCDs, TEDs PO pain control PT/OT D/C home today   Iline OvenBrian J Peter Daquila 12/21/2016, 6:58 AM   Samson FredericBrian Mattthew Ziomek, MD Cell [phone]

## 2016-12-21 NOTE — Progress Notes (Signed)
Discharge planning, no HH needs identified. Plan for HEP, has DME. [phone] 

## 2016-12-21 NOTE — Progress Notes (Signed)
Physical Therapy Treatment Patient Details Name: William BustardDavid Higgins MRN: [id number] DOB: 15-Jul-1955 Today's Date: 12/21/2016    History of Present Illness Pt s/p R THR    PT Comments    Pt progressing well with mobility and eager for dc home.  Spouse present to review stairs, car transfers and home therex with progression and written instruction.   Follow Up Recommendations  DC plan and follow up therapy as arranged by surgeon     Equipment Recommendations  None recommended by PT    Recommendations for Other Services OT consult     Precautions / Restrictions Precautions Precautions: Fall Restrictions Weight Bearing Restrictions: No Other Position/Activity Restrictions: WBAT    Mobility  Bed Mobility Overal bed mobility: Needs Assistance Bed Mobility: Supine to Sit     Supine to sit: Min guard     General bed mobility comments: cues for use of L LE to self assist and to SLOW down  Transfers Overall transfer level: Needs assistance Equipment used: Rolling walker (2 wheeled) Transfers: Sit to/from Stand Sit to Stand: Supervision         General transfer comment: cues for LE management and use of UEs to self assist  Ambulation/Gait Ambulation/Gait assistance: Supervision Ambulation Distance (Feet): 200 Feet Assistive device: Rolling walker (2 wheeled) Gait Pattern/deviations: Decreased step length - right;Decreased step length - left;Shuffle;Trunk flexed;Step-to pattern;Step-through pattern Gait velocity: decr Gait velocity interpretation: Below normal speed for age/gender General Gait Details: cues for posture, position from RW and initial sequence   Stairs            Wheelchair Mobility    Modified Rankin (Stroke Patients Only)       Balance Overall balance assessment: No apparent balance deficits (not formally assessed)                                          Cognition Arousal/Alertness: Awake/alert Behavior During Therapy:  WFL for tasks assessed/performed Overall Cognitive Status: Within Functional Limits for tasks assessed                                        Exercises Total Joint Exercises Ankle Circles/Pumps: AROM;Both;15 reps;Supine Quad Sets: AROM;Both;10 reps;Supine Heel Slides: AAROM;Right;20 reps;Supine Hip ABduction/ADduction: AAROM;Right;15 reps;Supine Long Arc Quad: AROM;Right;10 reps;Seated    General Comments        Pertinent Vitals/Pain Pain Assessment: 0-10 Pain Score: 3  Pain Location: R hip Pain Descriptors / Indicators: Aching;Sore Pain Intervention(s): Limited activity within patient's tolerance;Monitored during session;Ice applied    Home Living                      Prior Function            PT Goals (current goals can now be found in the care plan section) Acute Rehab PT Goals Patient Stated Goal: Regain IND and get back to the gym PT Goal Formulation: With patient Time For Goal Achievement: 12/22/16 Potential to Achieve Goals: Good Progress towards PT goals: Progressing toward goals    Frequency    7X/week      PT Plan Current plan remains appropriate    Co-evaluation              AM-PAC PT "6 Clicks" Daily Activity  Outcome Measure  Difficulty  turning over in bed (including adjusting bedclothes, sheets and blankets)?: A Little Difficulty moving from lying on back to sitting on the side of the bed? : A Little Difficulty sitting down on and standing up from a chair with arms (e.g., wheelchair, bedside commode, etc,.)?: A Little Help needed moving to and from a bed to chair (including a wheelchair)?: A Little Help needed walking in hospital room?: A Little Help needed climbing 3-5 steps with a railing? : A Little 6 Click Score: 18    End of Session Equipment Utilized During Treatment: Gait belt Activity Tolerance: Patient tolerated treatment well Patient left: in chair;with call bell/phone within reach;with  family/visitor present Nurse Communication: Mobility status PT Visit Diagnosis: Difficulty in walking, not elsewhere classified (R26.2)     Time: 1327-1410 PT Time Calculation (min) (ACUTE ONLY): 43 min  Charges:  $Gait Training: 8-22 mins $Therapeutic Exercise: 8-22 mins $Therapeutic Activity: 8-22 mins                    G Codes:       Pg [phone]    Kawanna Christley 12/21/2016, 2:12 PM

## 2016-12-27 ENCOUNTER — Encounter: Payer: Self-pay | Admitting: *Deleted

## 2016-12-27 ENCOUNTER — Other Ambulatory Visit: Payer: Self-pay | Admitting: *Deleted

## 2016-12-27 NOTE — Patient Outreach (Addendum)
[facility] Care Management  12/27/2016  Alm BustardDavid Risenhoover 10-10-1955 [id number]   Subjective: Telephone call to patient's home number, spoke with patient, and HIPAA verified.  Discussed [facility] Care [facility] [employer] Transition of care follow up, patient voiced understanding, and is in agreement to follow up.   Patient states he is doing great, able to perform home exercise program without difficulty, and has a follow up appointment with surgeon on 01/04/17.  Patient states he is able to manage self care and has assistance as needed.   Patient voices understanding of medical diagnosis, surgery, and treatment plan. States he is accessing his [employer]uerbach[employer] benefits as needed via member services number on back of card or through [email or web address].  Patient states he does not have any education material, transition of care, care coordination, disease management, disease monitoring, transportation, community resource, or pharmacy needs at this time.  States he is very appreciative of the follow up and is in agreement to receive [facility] information.     Objective: Per KPN (Knowledge Performance Now, point of care tool), [employer] iCollaborate, and chart review, patient hospitalized  12/20/16 -12/21/16 for Osteoarthritis of right hip.    Status post Right total hip arthroplasty, anterior approach on 12/20/16.   Patient also has a history of hypertension and chronic kidney disease stage 3.      Assessment: Received [employer] Transition of care referral on 12/25/16.  Transition of care follow up completed, no care management needs, and will proceed with case closure.      Plan: RNCM will send patient successful outreach letter, [facility] pamphlet, and magnet. RNCM will send case closure due to follow up completed / no care management needs request to Iverson AlaminLaura Greeson [facility].      Katanya Schlie H. Gardiner Barefootooper RN, BSN, CCM [facility] Care [facility] Telephonic CM Phone: [phone] Fax:  [phone]

## 2018-10-17 IMAGING — DX DG PORTABLE PELVIS
1 series · 1 of 1 positions shown · non-contrast
Comparison: 07/28/2016.

CLINICAL DATA: 61-year-old male status post right hip replacement.

EXAM:
DG C-ARM 61-120 MIN-NO REPORT; PORTABLE PELVIS 1-2 VIEWS

[pelvis ap]
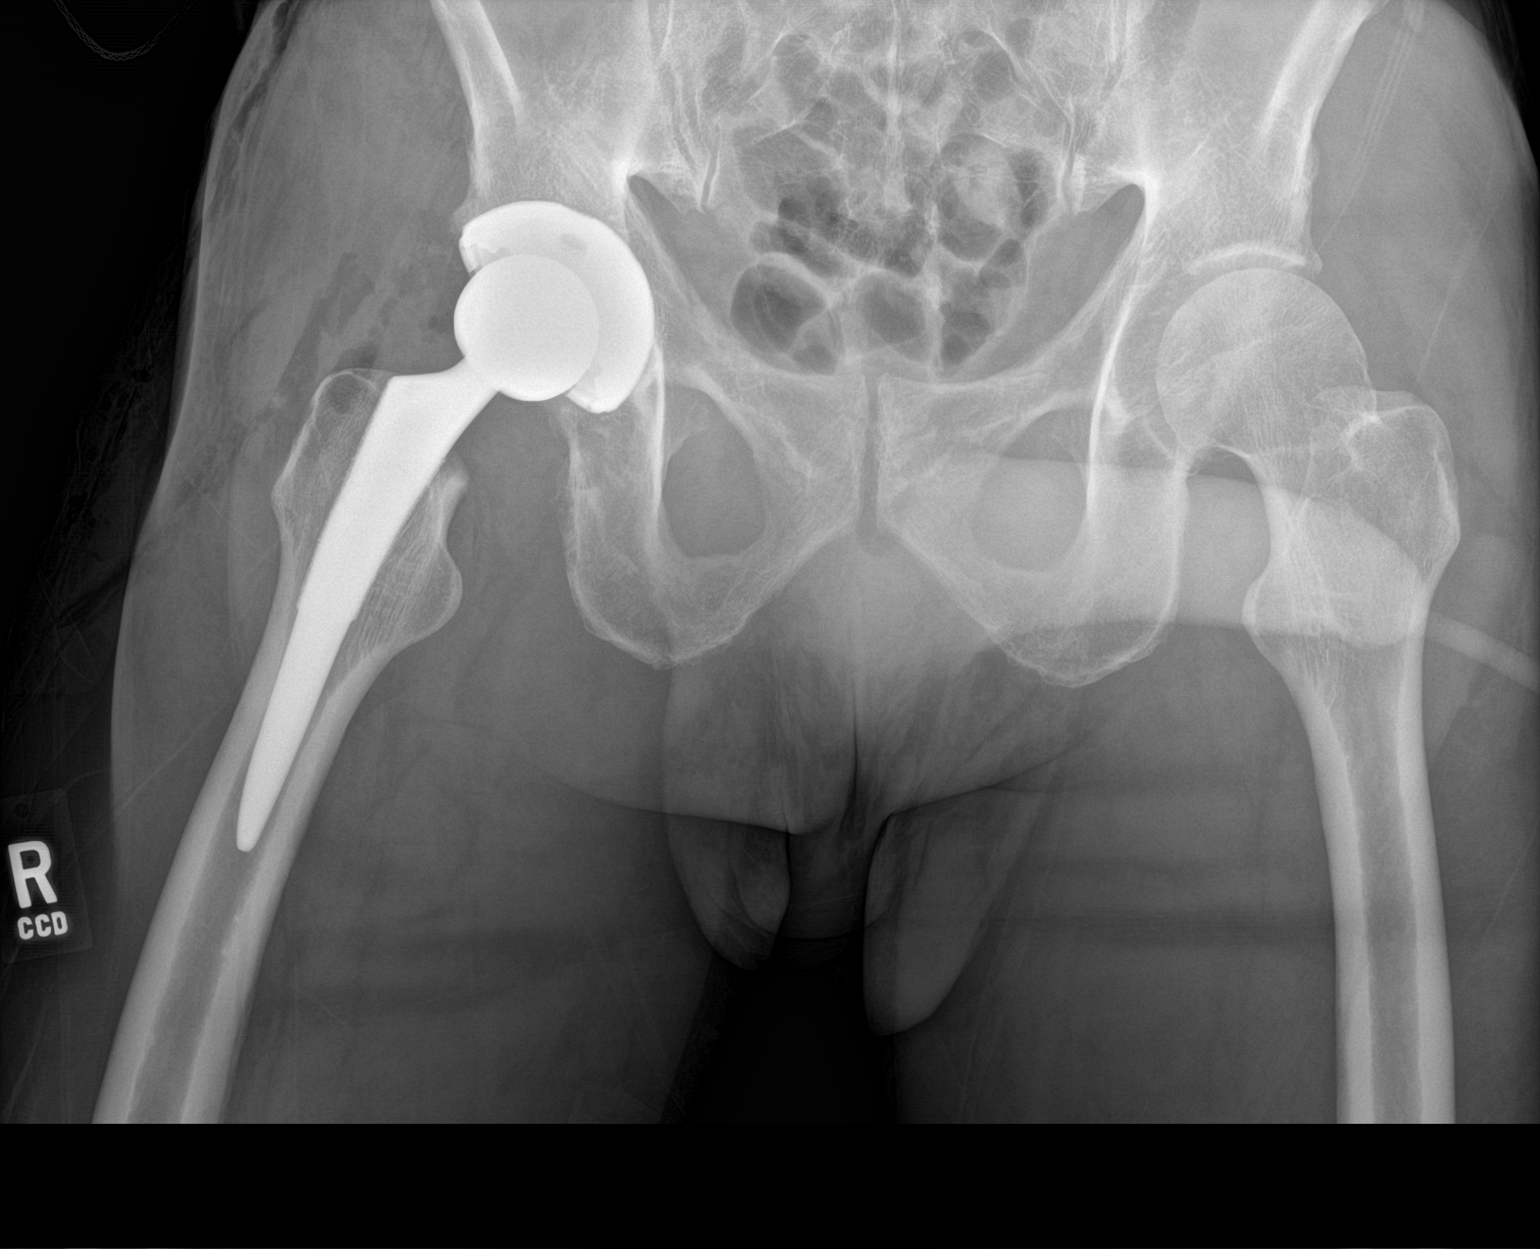

[1 of 1 positions shown; findings below may reference images not displayed]

FINDINGS: Multilevel intraoperative spot views of the pelvis document
placement of right hip arthroplasty. Single portable view of the
pelvis postoperatively demonstrates a new right hip prosthesis. Both
the femoral and acetabular components of the prosthesis appear
properly located without definite periprosthetic fracture or other
immediate complicating features. Gas is noted in the joint space and
the overlying soft tissues.
IMPRESSION: 1. Expected postoperative findings of right hip arthroplasty, as
above.

## 2019-03-23 DIAGNOSIS — M150 Primary generalized (osteo)arthritis: Secondary | ICD-10-CM | POA: Diagnosis not present

## 2019-03-23 DIAGNOSIS — N1831 Chronic kidney disease, stage 3a: Secondary | ICD-10-CM | POA: Diagnosis not present

## 2019-03-23 DIAGNOSIS — I10 Essential (primary) hypertension: Secondary | ICD-10-CM | POA: Diagnosis not present

## 2019-03-23 DIAGNOSIS — A6923 Arthritis due to Lyme disease: Secondary | ICD-10-CM | POA: Diagnosis not present

## 2019-03-23 DIAGNOSIS — M25462 Effusion, left knee: Secondary | ICD-10-CM | POA: Diagnosis not present

## 2019-09-05 ENCOUNTER — Telehealth: Payer: Self-pay | Admitting: Unknown Physician Specialty

## 2019-09-05 NOTE — Telephone Encounter (Signed)
Called to discuss with William Higgins about Covid symptoms and the use of bamlanivimab, a monoclonal antibody infusion for those with mild to moderate Covid symptoms and at a high risk of hospitalization.     Pt is qualified for this infusion at the [facility] infusion center due to co-morbid conditions and/or a member of an at-risk group, however declines infusion at this time. Symptoms tier reviewed as well as criteria for ending isolation.  Symptoms reviewed that would warrant ED/Hospital evaluation. Preventative practices reviewed. Patient verbalized understanding. Patient advised to call back if he decides that he does want to get infusion. Callback number to the infusion center given. Patient advised to go to Urgent care or ED with severe symptoms. Last date would be eligible for infusion is September 1 and 2.     Patient Active Problem List   Diagnosis Date Noted   Osteoarthritis of right hip 12/20/2016   Rectal prolapse 09/23/2012   Also has hypertension

## 2019-09-25 DIAGNOSIS — R05 Cough: Secondary | ICD-10-CM | POA: Diagnosis not present

## 2019-10-06 DIAGNOSIS — I10 Essential (primary) hypertension: Secondary | ICD-10-CM | POA: Diagnosis not present

## 2019-10-06 DIAGNOSIS — R1013 Epigastric pain: Secondary | ICD-10-CM | POA: Diagnosis not present

## 2019-10-06 DIAGNOSIS — K824 Cholesterolosis of gallbladder: Secondary | ICD-10-CM | POA: Diagnosis not present

## 2019-10-26 DIAGNOSIS — Z23 Encounter for immunization: Secondary | ICD-10-CM | POA: Diagnosis not present

## 2019-10-26 DIAGNOSIS — K824 Cholesterolosis of gallbladder: Secondary | ICD-10-CM | POA: Diagnosis not present

## 2019-11-30 DIAGNOSIS — K824 Cholesterolosis of gallbladder: Secondary | ICD-10-CM | POA: Diagnosis not present

## 2020-01-08 DIAGNOSIS — Z01818 Encounter for other preprocedural examination: Secondary | ICD-10-CM | POA: Diagnosis not present

## 2020-01-14 DIAGNOSIS — K811 Chronic cholecystitis: Secondary | ICD-10-CM | POA: Diagnosis not present

## 2020-01-14 DIAGNOSIS — K824 Cholesterolosis of gallbladder: Secondary | ICD-10-CM | POA: Diagnosis not present

## 2020-02-10 DIAGNOSIS — I129 Hypertensive chronic kidney disease with stage 1 through stage 4 chronic kidney disease, or unspecified chronic kidney disease: Secondary | ICD-10-CM | POA: Diagnosis not present

## 2020-02-10 DIAGNOSIS — N183 Chronic kidney disease, stage 3 unspecified: Secondary | ICD-10-CM | POA: Diagnosis not present

## 2020-02-10 DIAGNOSIS — Z13220 Encounter for screening for lipoid disorders: Secondary | ICD-10-CM | POA: Diagnosis not present

## 2020-02-10 DIAGNOSIS — Z8042 Family history of malignant neoplasm of prostate: Secondary | ICD-10-CM | POA: Diagnosis not present

## 2020-02-10 DIAGNOSIS — Z125 Encounter for screening for malignant neoplasm of prostate: Secondary | ICD-10-CM | POA: Diagnosis not present

## 2020-07-15 DIAGNOSIS — K469 Unspecified abdominal hernia without obstruction or gangrene: Secondary | ICD-10-CM | POA: Diagnosis not present

## 2020-09-05 DIAGNOSIS — K4090 Unilateral inguinal hernia, without obstruction or gangrene, not specified as recurrent: Secondary | ICD-10-CM | POA: Diagnosis not present

## 2020-09-18 DIAGNOSIS — H3561 Retinal hemorrhage, right eye: Secondary | ICD-10-CM | POA: Diagnosis not present

## 2020-09-18 DIAGNOSIS — H2513 Age-related nuclear cataract, bilateral: Secondary | ICD-10-CM | POA: Diagnosis not present

## 2020-09-18 DIAGNOSIS — H35031 Hypertensive retinopathy, right eye: Secondary | ICD-10-CM | POA: Diagnosis not present

## 2020-09-18 DIAGNOSIS — H43811 Vitreous degeneration, right eye: Secondary | ICD-10-CM | POA: Diagnosis not present

## 2020-09-19 ENCOUNTER — Other Ambulatory Visit: Payer: Self-pay

## 2020-09-19 ENCOUNTER — Encounter (INDEPENDENT_AMBULATORY_CARE_PROVIDER_SITE_OTHER): Payer: BC Managed Care – PPO | Admitting: Ophthalmology

## 2020-09-19 DIAGNOSIS — H35033 Hypertensive retinopathy, bilateral: Secondary | ICD-10-CM | POA: Diagnosis not present

## 2020-09-19 DIAGNOSIS — I10 Essential (primary) hypertension: Secondary | ICD-10-CM

## 2020-09-19 DIAGNOSIS — H43811 Vitreous degeneration, right eye: Secondary | ICD-10-CM | POA: Diagnosis not present

## 2020-09-19 DIAGNOSIS — H2513 Age-related nuclear cataract, bilateral: Secondary | ICD-10-CM | POA: Diagnosis not present

## 2020-12-05 DIAGNOSIS — D176 Benign lipomatous neoplasm of spermatic cord: Secondary | ICD-10-CM | POA: Diagnosis not present

## 2020-12-05 DIAGNOSIS — K4090 Unilateral inguinal hernia, without obstruction or gangrene, not specified as recurrent: Secondary | ICD-10-CM | POA: Diagnosis not present

## 2020-12-16 DIAGNOSIS — J40 Bronchitis, not specified as acute or chronic: Secondary | ICD-10-CM | POA: Diagnosis not present

## 2021-02-04 DIAGNOSIS — S68112A Complete traumatic metacarpophalangeal amputation of right middle finger, initial encounter: Secondary | ICD-10-CM | POA: Diagnosis not present

## 2021-02-04 DIAGNOSIS — S68622A Partial traumatic transphalangeal amputation of right middle finger, initial encounter: Secondary | ICD-10-CM | POA: Diagnosis not present

## 2021-02-04 DIAGNOSIS — W232XXA Caught, crushed, jammed or pinched between a moving and stationary object, initial encounter: Secondary | ICD-10-CM | POA: Diagnosis not present

## 2021-02-04 DIAGNOSIS — Z23 Encounter for immunization: Secondary | ICD-10-CM | POA: Diagnosis not present

## 2021-02-09 DIAGNOSIS — S68112A Complete traumatic metacarpophalangeal amputation of right middle finger, initial encounter: Secondary | ICD-10-CM | POA: Diagnosis not present

## 2021-02-14 DIAGNOSIS — I129 Hypertensive chronic kidney disease with stage 1 through stage 4 chronic kidney disease, or unspecified chronic kidney disease: Secondary | ICD-10-CM | POA: Diagnosis not present

## 2021-02-14 DIAGNOSIS — N183 Chronic kidney disease, stage 3 unspecified: Secondary | ICD-10-CM | POA: Diagnosis not present

## 2021-02-14 DIAGNOSIS — E7800 Pure hypercholesterolemia, unspecified: Secondary | ICD-10-CM | POA: Diagnosis not present

## 2021-02-24 DIAGNOSIS — Z125 Encounter for screening for malignant neoplasm of prostate: Secondary | ICD-10-CM | POA: Diagnosis not present

## 2021-02-24 DIAGNOSIS — E7800 Pure hypercholesterolemia, unspecified: Secondary | ICD-10-CM | POA: Diagnosis not present

## 2021-02-24 DIAGNOSIS — I10 Essential (primary) hypertension: Secondary | ICD-10-CM | POA: Diagnosis not present

## 2022-05-29 DIAGNOSIS — Z125 Encounter for screening for malignant neoplasm of prostate: Secondary | ICD-10-CM | POA: Diagnosis not present

## 2022-05-29 DIAGNOSIS — N183 Chronic kidney disease, stage 3 unspecified: Secondary | ICD-10-CM | POA: Diagnosis not present

## 2022-05-29 DIAGNOSIS — E7800 Pure hypercholesterolemia, unspecified: Secondary | ICD-10-CM | POA: Diagnosis not present

## 2022-05-29 DIAGNOSIS — Z23 Encounter for immunization: Secondary | ICD-10-CM | POA: Diagnosis not present

## 2022-05-29 DIAGNOSIS — E559 Vitamin D deficiency, unspecified: Secondary | ICD-10-CM | POA: Diagnosis not present

## 2022-05-29 DIAGNOSIS — I10 Essential (primary) hypertension: Secondary | ICD-10-CM | POA: Diagnosis not present

## 2023-06-05 ENCOUNTER — Other Ambulatory Visit (HOSPITAL_BASED_OUTPATIENT_CLINIC_OR_DEPARTMENT_OTHER): Payer: Self-pay | Admitting: Family Medicine

## 2023-06-05 DIAGNOSIS — E7800 Pure hypercholesterolemia, unspecified: Secondary | ICD-10-CM

## 2023-06-19 ENCOUNTER — Other Ambulatory Visit (HOSPITAL_COMMUNITY)

## 2023-07-08 ENCOUNTER — Ambulatory Visit (HOSPITAL_COMMUNITY)
Admission: RE | Admit: 2023-07-08 | Discharge: 2023-07-08 | Disposition: A | Discharge status: Home / Self Care | Payer: Self-pay | Source: Ambulatory Visit | Attending: Family Medicine | Admitting: Family Medicine

## 2023-07-08 DIAGNOSIS — E7800 Pure hypercholesterolemia, unspecified: Secondary | ICD-10-CM | POA: Insufficient documentation

## 2023-08-29 ENCOUNTER — Other Ambulatory Visit (HOSPITAL_COMMUNITY): Payer: Self-pay | Admitting: Medical

## 2023-08-29 ENCOUNTER — Ambulatory Visit (HOSPITAL_COMMUNITY)
Admission: RE | Admit: 2023-08-29 | Discharge: 2023-08-29 | Disposition: A | Discharge status: Home / Self Care | Source: Ambulatory Visit | Attending: Vascular Surgery | Admitting: Vascular Surgery

## 2023-08-29 DIAGNOSIS — R52 Pain, unspecified: Secondary | ICD-10-CM
